# Patient Record
Sex: Male | Born: 1985 | Race: White | Hispanic: No | Marital: Married | Smoking: Current every day smoker
Health system: Southern US, Community
[De-identification: ages and names within clinical notes are randomized; demographics above are authoritative.]

## PROBLEM LIST (undated history)

## (undated) DIAGNOSIS — M549 Dorsalgia, unspecified: Secondary | ICD-10-CM

## (undated) DIAGNOSIS — M419 Scoliosis, unspecified: Secondary | ICD-10-CM

## (undated) DIAGNOSIS — M51369 Other intervertebral disc degeneration, lumbar region without mention of lumbar back pain or lower extremity pain: Secondary | ICD-10-CM

## (undated) DIAGNOSIS — J45909 Unspecified asthma, uncomplicated: Secondary | ICD-10-CM

## (undated) DIAGNOSIS — M5136 Other intervertebral disc degeneration, lumbar region: Secondary | ICD-10-CM

## (undated) HISTORY — PX: OTHER SURGICAL HISTORY: SHX169

## (undated) HISTORY — PX: FRACTURE SURGERY: SHX138

## (undated) HISTORY — DX: Other intervertebral disc degeneration, lumbar region: M51.36

## (undated) HISTORY — DX: Scoliosis, unspecified: M41.9

## (undated) HISTORY — DX: Other intervertebral disc degeneration, lumbar region without mention of lumbar back pain or lower extremity pain: M51.369

## (undated) HISTORY — PX: LUNG SURGERY: SHX703

## (undated) HISTORY — PX: LEG SURGERY: SHX1003

---
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
## 2004-01-05 ENCOUNTER — Inpatient Hospital Stay (HOSPITAL_COMMUNITY): Admission: AC | Admit: 2004-01-05 | Discharge: 2004-01-19 | Payer: Self-pay

## 2004-01-07 ENCOUNTER — Ambulatory Visit: Payer: Self-pay | Admitting: Plastic Surgery

## 2004-04-20 ENCOUNTER — Ambulatory Visit (HOSPITAL_COMMUNITY): Admission: RE | Admit: 2004-04-20 | Discharge: 2004-04-21 | Payer: Self-pay | Admitting: Ophthalmology

## 2005-08-18 IMAGING — CR DG CHEST 1V PORT
1 series · 1 of 1 positions shown · non-contrast
Comparison: 01/09/04.

CLINICAL DATA: Head injury.  Trauma.  Follow-up aeration.  
 PORTABLE CHEST 01/10/04 TAKEN AT 6806 HOURS:

[view not recorded]
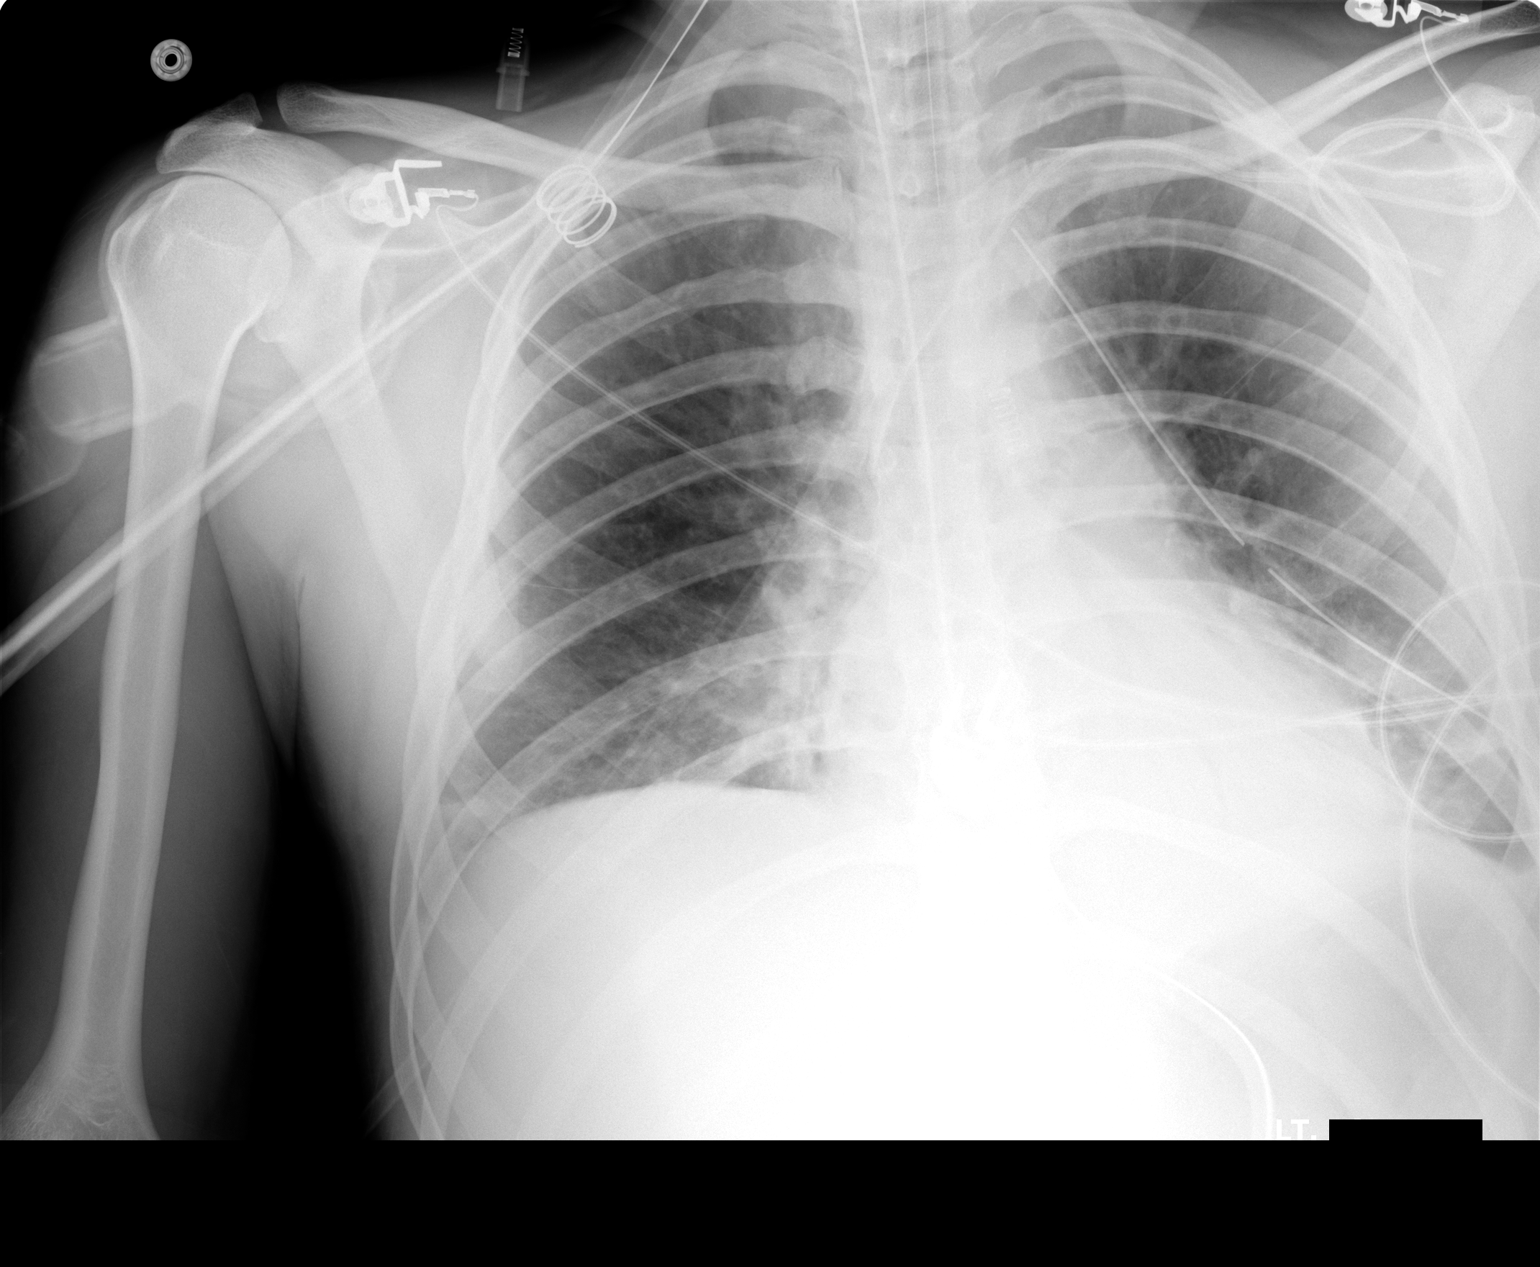

[1 of 1 positions shown; findings below may reference images not displayed]

There are bibasilar atelectatic changes which are stable.  Apical pleural capping noted on the left ? unchanged.  There is no pneumothorax.  Left-sided chest tube remains in satisfactory position as does the endotracheal tube and NG tube.  Central venous catheter tip is in the superior vena cava.
IMPRESSION: Bibasilar atelectatic changes.  No significant change in aeration.

## 2005-08-22 IMAGING — CR DG CHEST 1V PORT
1 series · 1 of 1 positions shown · non-contrast
Comparison: 01/13/04
 AP chest at 3271 hours shows no discernible change in position of the left subclavian central line.

CLINICAL DATA: questionable reposition of the central line in this agitated patient 
 PORTABLE CHEST

[view not recorded]
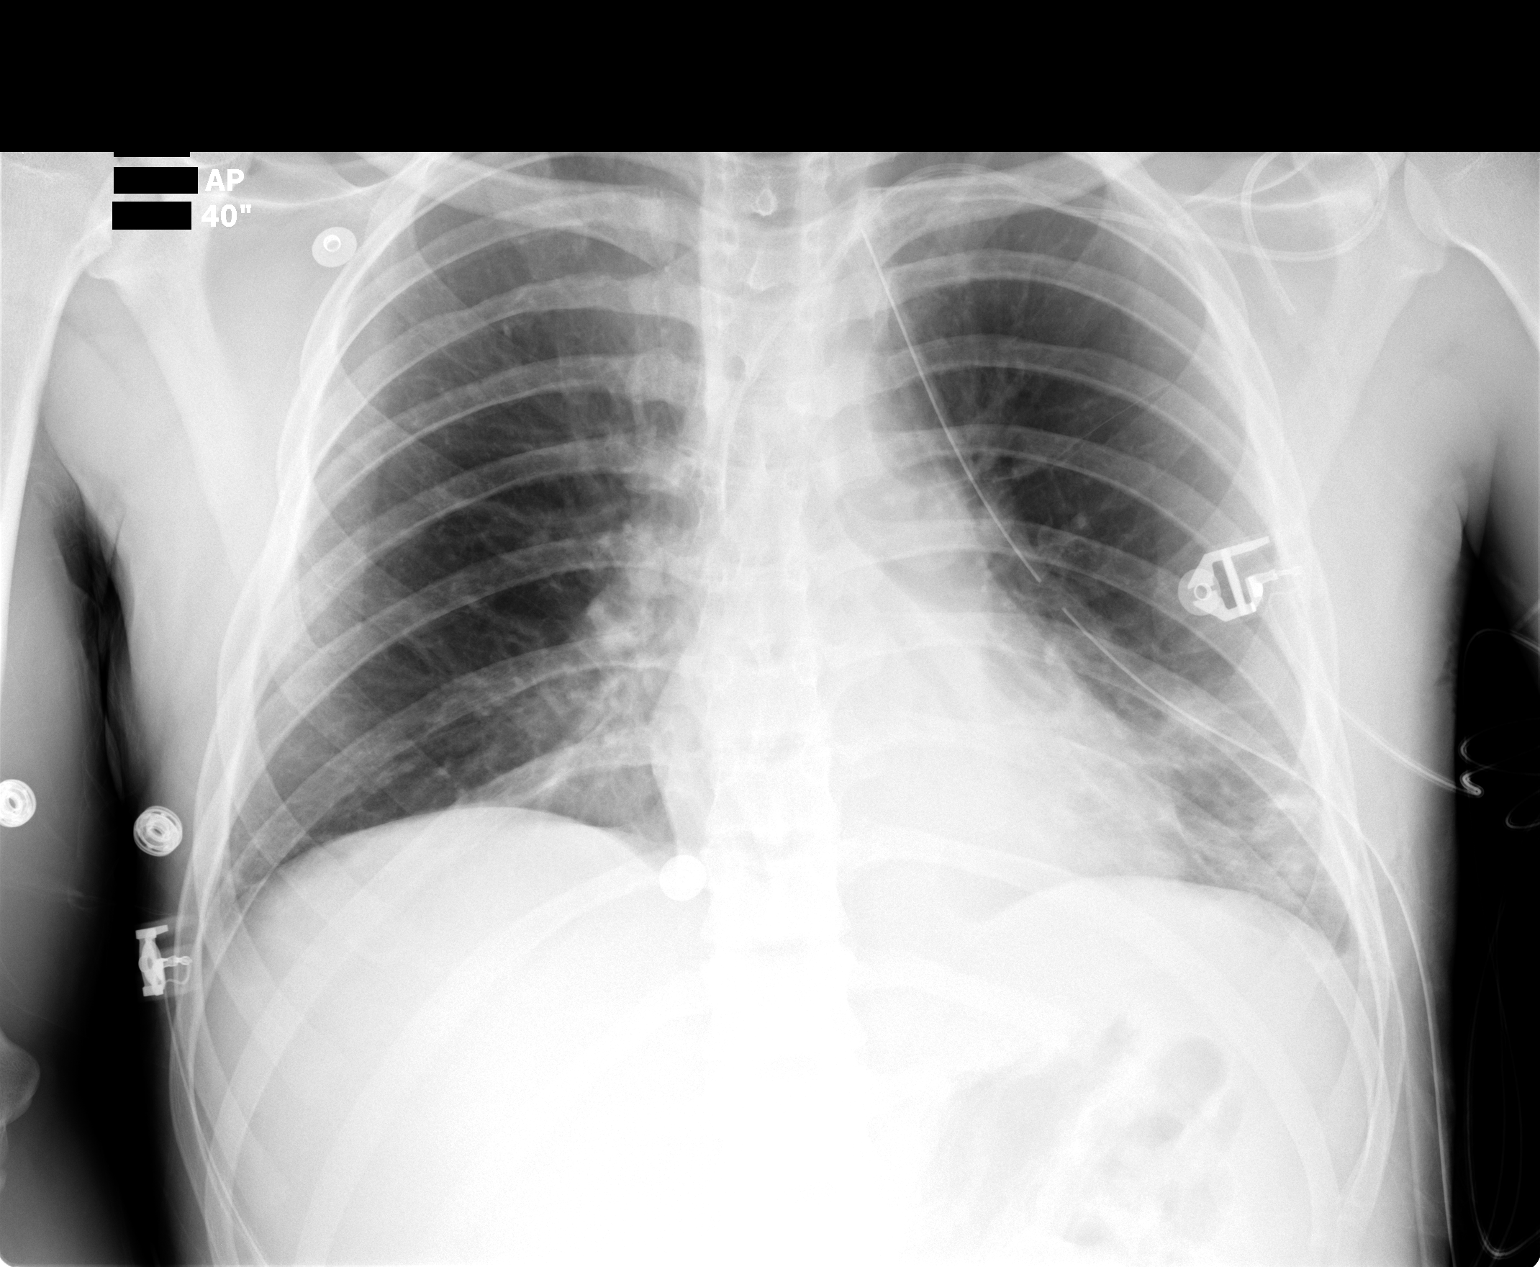

[1 of 1 positions shown; findings below may reference images not displayed]

Left chest tube remains in place without evidence for pneumothorax.  The pleural thickening in the left apex is unchanged.  Atelectasis at the left base is decreased in the interval.  Bones are unremarkable.  
 IMPRESSION
 1.  Decrease in left base atelectasis.
 2.  No discernible interval change in position of the left subclavian central line.

## 2005-08-23 IMAGING — CR DG CHEST 1V PORT
1 series · 1 of 1 positions shown · non-contrast
Comparison: none

CLINICAL DATA: Head injury with trauma.
 PORTABLE CHEST:
 Comparison 01/14/04.  
 AP chest at 2292 hours shows interval removal of the left chest tube without evidence for pneumothorax.  There is increased left base atelectasis.  The left subclavian central line remains in place.  Small left pleural effusion noted.

[view not recorded]
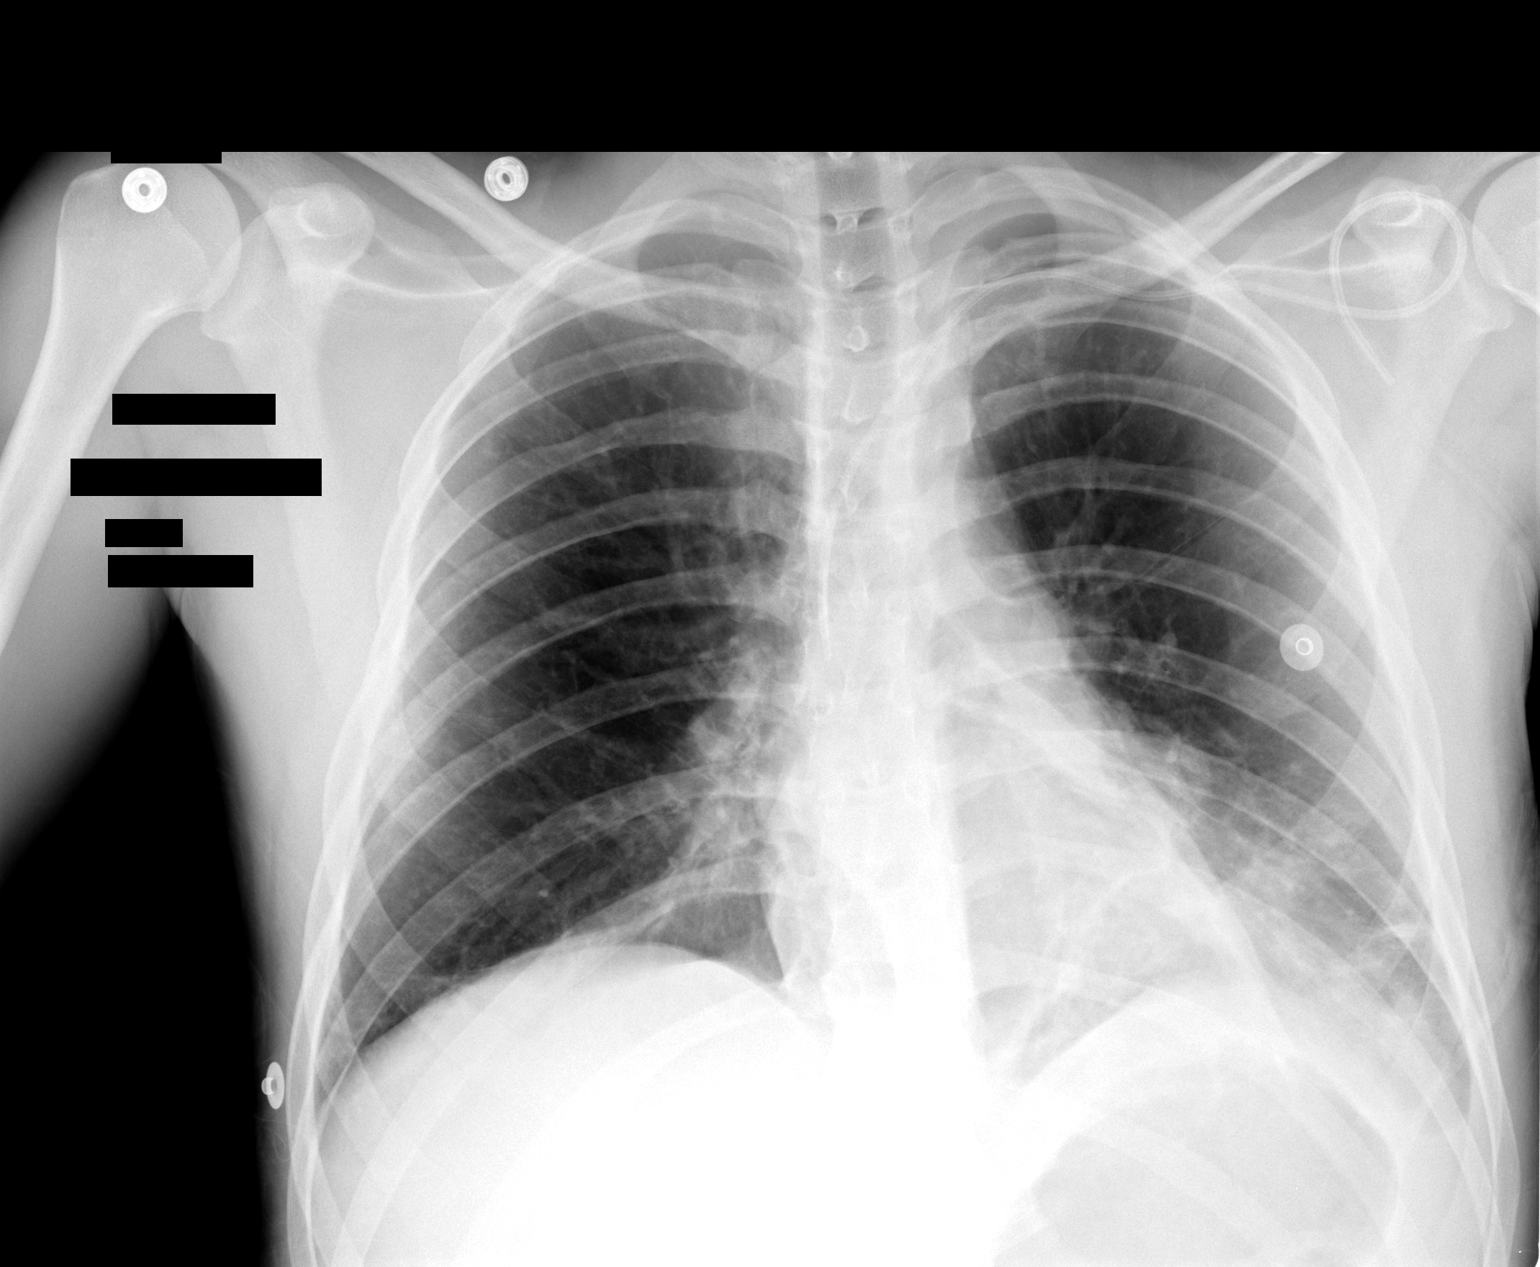

[1 of 1 positions shown; findings below may reference images not displayed]

IMPRESSION: 1.  Status post left chest tube removal without pneumothorax. 
 2.  Left base atelectasis with tiny left effusion.

## 2009-04-11 ENCOUNTER — Emergency Department (HOSPITAL_COMMUNITY): Admission: EM | Admit: 2009-04-11 | Discharge: 2009-04-11 | Payer: Self-pay | Admitting: Emergency Medicine

## 2010-07-09 ENCOUNTER — Other Ambulatory Visit: Payer: Self-pay | Admitting: Neurological Surgery

## 2010-07-09 DIAGNOSIS — M545 Low back pain: Secondary | ICD-10-CM

## 2010-07-12 ENCOUNTER — Ambulatory Visit
Admission: RE | Admit: 2010-07-12 | Discharge: 2010-07-12 | Disposition: A | Discharge status: Home / Self Care | Payer: Self-pay | Source: Ambulatory Visit | Attending: Neurological Surgery | Admitting: Neurological Surgery

## 2010-07-12 DIAGNOSIS — M545 Low back pain: Secondary | ICD-10-CM

## 2010-07-23 NOTE — Op Note (Signed)
NAME:  Roberto Dudley, DAHAN.:  [id number]   MEDICAL RECORD NO.:  [id number]          PATIENT TYPE:  OIB   LOCATION:  2899                         FACILITY:  [facility]   PHYSICIAN:  Doralee Albino. Carola Frost, M.D. DATE OF BIRTH:  Dec 29, 1985   DATE OF PROCEDURE:  04/20/2004  DATE OF DISCHARGE:                                 OPERATIVE REPORT   PREOPERATIVE DIAGNOSIS:  Delayed union left grade 3B tibia.   POSTOPERATIVE DIAGNOSIS:  Delayed union left grade 3B tibia.   PROCEDURE:  1.  Left tibia nail removal.  2.  Left tibia compression nailing w/ DePuy 10mm x , dynamically      locked.  3.  Left fibular osteotomy.   SURGEON:  Doralee Albino. Carola Frost, M.D.   ASSISTANT:  Cecil Cranker, P.A.-C.   ANESTHESIA:  General.   COMPLICATIONS:  None.   SPECIMENS:  Two aerobic and anaerobic cultures sent to microbiology for  analysis.   ESTIMATED BLOOD LOSS:  Less than 100 mL.   DISPOSITION:  PACU.   CONDITION:  Stable.   INDICATIONS FOR PROCEDURE:  Roberto Dudley is a young man who sustained a grade 3B  open tibial fracture over three months ago which was treated initially with  irrigation, debridement, and intramedullary nailing, followed by subsequent  irrigation, debridement, and finally a soft tissue flap performed by Dr.  Lenis Noon.  We continued to follow him for his fracture.  He has gone on to  heal all his soft tissue wounds which were considerable and also to re-  establish full range of motion of the knee and ankle.  Unfortunately, he has  developed over the past month, a more clearly defined nonunion site along  the mid shaft and area of his graft.  He has had no signs of infection such  as fever, malaise, or even significant pain.  We discussed with him the  failure of his tibia to progress toward union and discussed, at length, the  alternatives which included dynization and open iliac crest bone graft as  well as possible exchange nailing.  We felt that exchange  nailing with  fibular osteotomy would offer him the best chance to progress toward union  without sustaining hardware failure and without the need to formally open  his flap site with greater risk to the soft tissue flap.  He understood the  risks to include damage to the pedicle of his flap, nerve injury, vessel  injury, malunion, nonunion, infection, and need for further surgery.  After  discussion of these risks and benefits, he did elect to proceed.   DESCRIPTION OF PROCEDURE:  Roberto Dudley was taken to the operating room after  obtaining preoperative laboratory studies including sed rate and CRP.  After  cultures were obtained, he was administered antibiotics consisting of Ancef.  Stab incisions were made through his old wounds to remove proximal two  locking screws and one of the distal locking screws.  The other was  partially backed out and left in place to prevent rotation during capture of  the tibial nail.  A 15 mm incision over the anterior aspect  of the knee was  made and dissection was carried through the paratenon and around the medial  side of the patellar tendon to the proximal aspect of the knee where, using  an instrument, the tip of the nail was identified.  It was then captured and  after removing the last distal locking screw, it was extracted.  It was then  sequentially reamed from 8 mm which was the diameter of the nail up to 11 mm  without encountering any resistance until the 10.5 mm reamer.  We did check  and make sure that this was not excessively reaming the canal.  We then  inserted a 10 mm by 315 mm DePuy nail.  We then placed the two static distal  locks, angling our distal locks anterior to the fibula so as not to  interfere with the syndesmosis.  Once these two distal locks were placed,  the nail was then back flapped to see if the delayed union site closed down  appreciably.  It did close some but not completely, consequently, we deemed  it necessary to  perform a formal fibular osteotomy.  This was performed  proximal to the area of the flap.  This did incur possible risk to the  peroneal artery which may have been contributing to the flap blood supply,  but a more distal osteotomy to the flap would have affected his ankle  mechanics.  We performed a formal open exposure of the fibula to minimize  the risks to his arterial supply and placed retractors subperiosteally  circumferentially around the fibula.  We were also careful to minimize  retraction against the superficial peroneal nerve.  The sagittal saw was  used to remove 6 mm of bone.  The nail was once again back flapped several  times and then locked dynamically with one proximal screw.  We obtained  multiple images throughout the procedure in order to insure anatomic  alignment remained and final x-rays also confirmed appropriate alignment,  length of the nail, and hardware position.  The tourniquet was never  inflated during the case.  All wounds were copiously irrigated and then  closed in standard layered fashion with 2-0 Vicryl and staples for the skin.  A sterile dressing was applied but no splint.  He was then taken to the PACU  in stable condition.   PROGNOSIS:  Roberto Dudley's prognosis for healing is good so long as his  cultures remain negative.  We were able to replace his nail with a  considerably more stout nail which should allow him to weight bear with  considerably less risks of hardware failure.  With the osteotomy, he should  be able to also obtain some compression at the fracture site.  He will need  to remain compliant with smoking cessation and also has been taken off  ibuprofen and other NSAIDs as this may contribute to nonunion, as well.  He  should be able to maintain both knee and ankle motion.  We will put him on  Fragmin during his hospital stay for DVT prophylaxis.  It is unlikely that  he will need prolonged prophylaxis as he has been  mobile.     MHH/MEDQ  D:  04/20/2004  T:  04/20/2004  Job:  [id number]

## 2010-07-23 NOTE — Op Note (Signed)
NAME:  Roberto Dudley, Roberto Dudley NO.:  [id number]   MEDICAL RECORD NO.:  [id number]                   PATIENT TYPE:  EMS   LOCATION:  MAJO                                 FACILITY:  [facility]   PHYSICIAN:  Adolph Pollack, M.D.            DATE OF BIRTH:  1985/05/12   DATE OF PROCEDURE:  10/02/2001  DATE OF DISCHARGE:  10/01/2001                                 OPERATIVE REPORT   PREOPERATIVE DIAGNOSIS:  Pilonidal abscess.   POSTOPERATIVE DIAGNOSIS:  Extensive pilonidal abscess.   PROCEDURE:  Incision and drainage of pilonidal abscess and extensive  pilonidal cystectomy.   SURGEON:  Adolph Pollack, M.D.   ANESTHESIA:  General.   INDICATIONS FOR SURGERY:  The patient is a 25 year old male who apparently  fell on a skateboard some time in the recent past.  He had been complaining  of gluteal cleft pain.  He was taken to the emergency department Monday  night and then sent over to the office where he saw Dr. Lindie Spruce this morning.  He had an extensive abscess that Dr. Lindie Spruce felt needed to be drained in the  operating room and now he presents for that.  I had a long discussion with  him about drainage of the pilonidal abscess in possible staged process with  drainage of the abscess and a cystectomy, or a large procedure all at once.  The risks, specifically bleeding, were explained to him.  His mom was  present as well.   OPERATIVE TECHNIQUE:  He was placed supine on the stretcher and then general  anesthesia was administered.  He was then placed prone on the operating  table and pressure points were padded.  He had a draining abscess to the  right of the gluteal cleft in the inferior aspect.  The area was shaved and  sterilely prepped and draped, and placed a hemostat into the cavity.  It was  a very large cavity with purulent drainage and I pulled out a large tuft of  hair.  I subsequently began opening this up and connecting it with the  gluteal cleft.   He had multiple little pits present and these were draining  purulent material as well.  I found multiple small abscess pockets and then  I found a cyst with hair in it.  I debrided the cyst wall, removed the hair  as much as possible, but essentially I extended the incision all the way up  the gluteal cleft.  I then debrided more of the inflamed material and  inflamed cyst.  I felt I got the majority of it.  I found no more hair.   I then controlled bleeding with cautery, using it extensively.  I then  packed the wound and when hemostasis was adequate I removed the dry packs.  I inspected it again and noted no further bleeding.  I then dampened three  4x4 gauze sponges and packed  them tightly into the wound.  This was covered  by dry 4x4 gauze and then by an ABD pad.  He was then placed supine on the  stretcher, extubated, and taken to the recovery room in satisfactory  condition.   He tolerated the procedure well without apparent complications.  His mother  was given specific instructions and told to call if there ws bleeding.  He  will return to the office to be seen in 48 hours for dressing change.                                                   Adolph Pollack, M.D.    Kari Baars  D:  10/02/2001  T:  10/04/2001  Job:  [id number]   cc:   Bryan Lemma. Manus Gunning, M.D.

## 2012-01-25 ENCOUNTER — Emergency Department (HOSPITAL_COMMUNITY)
Admission: EM | Admit: 2012-01-25 | Discharge: 2012-01-25 | Disposition: A | Discharge status: Home / Self Care | Payer: Medicaid Other | Attending: Emergency Medicine | Admitting: Emergency Medicine

## 2012-01-25 ENCOUNTER — Emergency Department (HOSPITAL_COMMUNITY): Payer: Medicaid Other

## 2012-01-25 ENCOUNTER — Encounter (HOSPITAL_COMMUNITY): Payer: Self-pay | Admitting: Emergency Medicine

## 2012-01-25 DIAGNOSIS — S91109A Unspecified open wound of unspecified toe(s) without damage to nail, initial encounter: Secondary | ICD-10-CM | POA: Insufficient documentation

## 2012-01-25 DIAGNOSIS — Z23 Encounter for immunization: Secondary | ICD-10-CM | POA: Insufficient documentation

## 2012-01-25 DIAGNOSIS — S91119A Laceration without foreign body of unspecified toe without damage to nail, initial encounter: Secondary | ICD-10-CM

## 2012-01-25 DIAGNOSIS — J45909 Unspecified asthma, uncomplicated: Secondary | ICD-10-CM | POA: Insufficient documentation

## 2012-01-25 DIAGNOSIS — W268XXA Contact with other sharp object(s), not elsewhere classified, initial encounter: Secondary | ICD-10-CM | POA: Insufficient documentation

## 2012-01-25 DIAGNOSIS — F17200 Nicotine dependence, unspecified, uncomplicated: Secondary | ICD-10-CM | POA: Insufficient documentation

## 2012-01-25 DIAGNOSIS — Y9289 Other specified places as the place of occurrence of the external cause: Secondary | ICD-10-CM | POA: Insufficient documentation

## 2012-01-25 DIAGNOSIS — Y9389 Activity, other specified: Secondary | ICD-10-CM | POA: Insufficient documentation

## 2012-01-25 HISTORY — DX: Unspecified asthma, uncomplicated: J45.909

## 2012-01-25 MED ORDER — TRAMADOL HCL 50 MG PO TABS: 50.0000 mg | ORAL_TABLET | Freq: Four times a day (QID) | ORAL | Status: DC | PRN

## 2012-01-25 MED ORDER — CIPROFLOXACIN HCL 500 MG PO TABS: 500.0000 mg | ORAL_TABLET | Freq: Two times a day (BID) | ORAL | Status: DC

## 2012-01-25 MED ORDER — TETANUS-DIPHTH-ACELL PERTUSSIS 5-2.5-18.5 LF-MCG/0.5 IM SUSP
0.5000 mL | Freq: Once | INTRAMUSCULAR | Status: AC
  Administered 2012-01-25: 0.5 mL via INTRAMUSCULAR
  Filled 2012-01-25: qty 0.5

## 2012-01-25 NOTE — ED Notes (Signed)
Patient transported to X-ray 

## 2012-01-25 NOTE — ED Provider Notes (Signed)
Medical screening examination/treatment/procedure(s) were performed by non-physician practitioner and as supervising physician I was immediately available for consultation/collaboration.   Tobin Chad, MD 01/25/12 [phone]

## 2012-01-25 NOTE — ED Notes (Signed)
Laceration to posterior left second digit. Bleeding is controlled at present.

## 2012-01-25 NOTE — ED Provider Notes (Signed)
History     CSN: [id number]  Arrival date & time 01/25/12  9562   First MD Initiated Contact with Patient 01/25/12 0848      Chief Complaint  Patient presents with  . Extremity Laceration    (Consider location/radiation/quality/duration/timing/severity/associated sxs/prior treatment) HPI Presents with chief complaint of laceration to the plantar surface of the second and third left toes.  Patient states that he stepped on a piece of glass in his hallway last night.  Bleeding is currently controlled.  Denies any numbness or tingling of the feet.  Unsure of last tetanus vaccination.  No paresthesias.  Able to move toes.  Denies fevers, chills, myalgias, arthralgias. Denies DOE, SOB, chest tightness or pressure, radiation to left arm, jaw or back, or diaphoresis. Denies dysuria, flank pain, suprapubic pain, frequency, urgency, or hematuria. Denies headaches, light headedness, weakness, visual disturbances. Denies abdominal pain, nausea, vomiting, diarrhea or constipation.   Past Medical History  Diagnosis Date  . Asthma     Past Surgical History  Procedure Date  . Leg surgery   . Trhoat surgery   . Lung surgery   . Fracture surgery     No family history on file.  History  Substance Use Topics  . Smoking status: Current Every Day Smoker -- 0.5 packs/day    Types: Cigarettes  . Smokeless tobacco: Not on file  . Alcohol Use: No      Review of Systems Ten systems are reviewed and are negative for acute change except as noted in the HPI  Allergies  Review of patient's allergies indicates no known allergies.  Home Medications   Current Outpatient Rx  Name  Route  Sig  Dispense  Refill  . CIPROFLOXACIN HCL 500 MG PO TABS   Oral   Take 1 tablet (500 mg total) by mouth 2 (two) times daily. One po bid x 7 days   14 tablet   0   . TRAMADOL HCL 50 MG PO TABS   Oral   Take 1 tablet (50 mg total) by mouth every 6 (six) hours as needed for pain.   15 tablet   0      BP 133/87  Pulse 87  Temp 98 F (36.7 C) (Oral)  Resp 19  Wt 157 lb (71.215 kg)  SpO2 99%  Physical Exam  Musculoskeletal:       Feet:   Physical Exam  Nursing note and vitals reviewed. Constitutional: He appears well-developed and well-nourished. No distress.  HENT:  Head: Normocephalic and atraumatic.  Eyes: Conjunctivae normal are normal. No scleral icterus.  Neck: Normal range of motion. Neck supple.  Cardiovascular: Normal rate, regular rhythm and normal heart sounds.   Pulmonary/Chest: Effort normal and breath sounds normal. No respiratory distress.  Abdominal: Soft. There is no tenderness.  Musculoskeletal: He exhibits no edema.  Neurological: He is alert.  Skin: Skin is warm and dry. He is not diaphoretic.  Psychiatric: His behavior is normal.    ED Course  Procedures (including critical care time)  Labs Reviewed - No data to display Dg Foot Complete Left  01/25/2012  *RADIOLOGY REPORT*  Clinical Data: Left foot pain and lacerations.  Stepped in glass. Evaluate for foreign body.  LEFT FOOT - COMPLETE 3+ VIEW  Comparison: 01/05/2004.  Findings: No acute osseous or joint abnormality.  No radiopaque foreign body.  Intramedullary rod and two distal interlocking screws are incidentally imaged in the distal tibia.  IMPRESSION: No acute osseous or joint abnormality.  No radiopaque  foreign body.   Original Report Authenticated By: Leanna Battles, M.D.    LACERATION REPAIR Performed by: Arthor Captain Authorized by: Arthor Captain Consent: Verbal consent obtained. Risks and benefits: risks, benefits and alternatives were discussed Consent given by: patient Patient identity confirmed: provided demographic data Prepped and Draped in normal sterile fashion Wound explored  Laceration Location: plantar surface left 2nd toe  Laceration Length: 2cm  No Foreign Bodies seen or palpated  Anesthesia: local infiltration  Local anesthetic: lidocaine 1% without  epinephrine  Anesthetic total: 2 ml  Irrigation method: syringe Amount of cleaning: standard  Skin closure: 4.0 prolene  Number of sutures: 4  Technique: so  Patient tolerance: Patient tolerated the procedure well with no immediate complications.   1. Laceration of toe of left foot       MDM  Laceration T. Updated Cover for Pseudomonas Discharge with pain control and antibiotics.  Followup in 8 days for suture removal. Discussed reasons to seek immediate care. Patient expresses understanding and agrees with plan.         Arthor Captain, PA-C 01/25/12 1611

## 2012-01-25 NOTE — ED Notes (Signed)
States that he stepped on glass around 0000. States that [employer] came and wrapped wound and informed him that he may need stitches.

## 2012-03-18 ENCOUNTER — Emergency Department (HOSPITAL_COMMUNITY)
Admission: EM | Admit: 2012-03-18 | Discharge: 2012-03-18 | Disposition: A | Discharge status: Home / Self Care | Payer: Medicaid Other | Source: Home / Self Care | Attending: Emergency Medicine | Admitting: Emergency Medicine

## 2012-03-18 ENCOUNTER — Encounter (HOSPITAL_COMMUNITY): Payer: Self-pay | Admitting: Emergency Medicine

## 2012-03-18 DIAGNOSIS — H1030 Unspecified acute conjunctivitis, unspecified eye: Secondary | ICD-10-CM

## 2012-03-18 DIAGNOSIS — H1032 Unspecified acute conjunctivitis, left eye: Secondary | ICD-10-CM

## 2012-03-18 MED ORDER — POLYMYXIN B-TRIMETHOPRIM 10000-0.1 UNIT/ML-% OP SOLN: 1.0000 [drp] | OPHTHALMIC | Status: DC

## 2012-03-18 NOTE — ED Provider Notes (Signed)
History     CSN: [id number]  Arrival date & time 03/18/12  1630   First MD Initiated Contact with Patient 03/18/12 1813      Chief Complaint  Patient presents with  . Conjunctivitis    left eye red and drainage. denies irritation and pain. symptoms x today.    (Consider location/radiation/quality/duration/timing/severity/associated sxs/prior treatment) Patient is a 27 y.o. male presenting with conjunctivitis. The history is provided by the patient.  Conjunctivitis  The current episode started today. The problem occurs frequently. The problem has been unchanged. The problem is mild. Nothing relieves the symptoms. Nothing aggravates the symptoms. Associated symptoms include congestion, rhinorrhea, eye discharge and eye redness. Pertinent negatives include no fever, no decreased vision, no double vision, no eye itching, no photophobia, no ear discharge, no ear pain, no headaches, no hearing loss, no mouth sores, no sore throat, no stridor, no swollen glands and no eye pain. The eye pain is not associated with movement. The eyelid exhibits no abnormality. He has been behaving normally. There were no sick contacts.    Past Medical History  Diagnosis Date  . Asthma     Past Surgical History  Procedure Date  . Leg surgery   . Trhoat surgery   . Lung surgery   . Fracture surgery     History reviewed. No pertinent family history.  History  Substance Use Topics  . Smoking status: Current Every Day Smoker -- 0.5 packs/day    Types: Cigarettes  . Smokeless tobacco: Not on file  . Alcohol Use: No      Review of Systems  Constitutional: Negative for fever.  HENT: Positive for congestion and rhinorrhea. Negative for hearing loss, ear pain, sore throat, mouth sores and ear discharge.   Eyes: Positive for discharge and redness. Negative for double vision, photophobia, pain and itching.  Respiratory: Negative for stridor.   Neurological: Negative for headaches.  All other systems  reviewed and are negative.    Allergies  Review of patient's allergies indicates no known allergies.  Home Medications   Current Outpatient Rx  Name  Route  Sig  Dispense  Refill  . CIPROFLOXACIN HCL 500 MG PO TABS   Oral   Take 1 tablet (500 mg total) by mouth 2 (two) times daily. One po bid x 7 days   14 tablet   0   . TRAMADOL HCL 50 MG PO TABS   Oral   Take 1 tablet (50 mg total) by mouth every 6 (six) hours as needed for pain.   15 tablet   0   . POLYMYXIN B-TRIMETHOPRIM 10000-0.1 UNIT/ML-% OP SOLN   Left Eye   Place 1 drop into the left eye every 4 (four) hours.   10 mL   0     BP 124/81  Pulse 84  Temp 98.4 F (36.9 C) (Oral)  Resp 18  SpO2 99%  Physical Exam  Nursing note and vitals reviewed. Constitutional: He is oriented to person, place, and time. Vital signs are normal. He appears well-developed and well-nourished. He is active and cooperative.  HENT:  Head: Normocephalic.  Right Ear: Tympanic membrane and external ear normal.  Left Ear: Tympanic membrane and external ear normal.  Nose: Nose normal. Right sinus exhibits no maxillary sinus tenderness and no frontal sinus tenderness. Left sinus exhibits no maxillary sinus tenderness and no frontal sinus tenderness.  Mouth/Throat: Uvula is midline and oropharynx is clear and moist. No oropharyngeal exudate.  Eyes: EOM are normal. Pupils are  equal, round, and reactive to light. Right eye exhibits no chemosis, no discharge, no exudate and no hordeolum. No foreign body present in the right eye. Left eye exhibits no chemosis, no discharge, no exudate and no hordeolum. No foreign body present in the left eye. Right conjunctiva is not injected. Right conjunctiva has no hemorrhage. Left conjunctiva is injected. Left conjunctiva has no hemorrhage. No scleral icterus.  Neck: Trachea normal and normal range of motion. Neck supple.  Cardiovascular: Normal rate, regular rhythm, normal heart sounds, intact distal pulses  and normal pulses.   Pulmonary/Chest: Effort normal and breath sounds normal.  Lymphadenopathy:       Head (right side): No submental, no submandibular, no tonsillar, no preauricular, no posterior auricular and no occipital adenopathy present.       Head (left side): No submental, no submandibular, no tonsillar, no preauricular, no posterior auricular and no occipital adenopathy present.    He has no cervical adenopathy.  Neurological: He is alert and oriented to person, place, and time. No cranial nerve deficit or sensory deficit.  Skin: Skin is warm and dry.  Psychiatric: He has a normal mood and affect. His speech is normal and behavior is normal. Judgment and thought content normal. Cognition and memory are normal.    ED Course  Procedures (including critical care time)  Labs Reviewed - No data to display No results found.   1. Conjunctivitis, acute, left eye       MDM  Warm compresses.  Eye drops as prescribed.           Johnsie Kindred, NP 03/18/12 1819

## 2012-03-18 NOTE — ED Notes (Signed)
Pt states that he woke this a.m with redness of the left eye with some drainage. Pt denies irritation or pain and any other symptoms.

## 2012-03-18 NOTE — ED Provider Notes (Signed)
Medical screening examination/treatment/procedure(s) were performed by non-physician practitioner and as supervising physician I was immediately available for consultation/collaboration.  Anna Livers, M.D.   Stepfon Rawles C Karisa Nesser, MD 03/18/12 1909 

## 2013-10-11 ENCOUNTER — Emergency Department: Payer: Self-pay | Admitting: Student

## 2013-11-11 ENCOUNTER — Emergency Department: Payer: Self-pay | Admitting: Emergency Medicine

## 2014-08-18 ENCOUNTER — Encounter: Payer: Self-pay | Admitting: Emergency Medicine

## 2014-08-18 ENCOUNTER — Emergency Department: Payer: Medicaid Other

## 2014-08-18 ENCOUNTER — Emergency Department
Admission: EM | Admit: 2014-08-18 | Discharge: 2014-08-18 | Disposition: A | Discharge status: Home / Self Care | Payer: Medicaid Other | Attending: Emergency Medicine | Admitting: Emergency Medicine

## 2014-08-18 DIAGNOSIS — X58XXXA Exposure to other specified factors, initial encounter: Secondary | ICD-10-CM | POA: Insufficient documentation

## 2014-08-18 DIAGNOSIS — Y9289 Other specified places as the place of occurrence of the external cause: Secondary | ICD-10-CM | POA: Diagnosis not present

## 2014-08-18 DIAGNOSIS — Y9389 Activity, other specified: Secondary | ICD-10-CM | POA: Insufficient documentation

## 2014-08-18 DIAGNOSIS — S8992XA Unspecified injury of left lower leg, initial encounter: Secondary | ICD-10-CM | POA: Diagnosis present

## 2014-08-18 DIAGNOSIS — Y998 Other external cause status: Secondary | ICD-10-CM | POA: Insufficient documentation

## 2014-08-18 DIAGNOSIS — Z720 Tobacco use: Secondary | ICD-10-CM | POA: Diagnosis not present

## 2014-08-18 DIAGNOSIS — M25462 Effusion, left knee: Secondary | ICD-10-CM

## 2014-08-18 MED ORDER — DICLOFENAC POTASSIUM 50 MG PO TABS: 50.0000 mg | ORAL_TABLET | Freq: Three times a day (TID) | ORAL | Status: DC

## 2014-08-18 MED ORDER — HYDROCODONE-ACETAMINOPHEN 5-325 MG PO TABS: 1.0000 | ORAL_TABLET | ORAL | Status: DC | PRN

## 2014-08-18 NOTE — Discharge Instructions (Signed)

## 2014-08-18 NOTE — ED Notes (Signed)
assessmnet per PA 

## 2014-08-18 NOTE — ED Notes (Signed)
Left knee pain worse after playing game , x2 months ago original injury " fells like it pops every so often",
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
## 2014-12-05 ENCOUNTER — Emergency Department: Payer: Medicaid Other

## 2014-12-05 ENCOUNTER — Emergency Department
Admission: EM | Admit: 2014-12-05 | Discharge: 2014-12-05 | Disposition: A | Discharge status: Home / Self Care | Payer: Medicaid Other | Attending: Student | Admitting: Student

## 2014-12-05 ENCOUNTER — Encounter: Payer: Self-pay | Admitting: Emergency Medicine

## 2014-12-05 DIAGNOSIS — Y9389 Activity, other specified: Secondary | ICD-10-CM | POA: Diagnosis not present

## 2014-12-05 DIAGNOSIS — S63601A Unspecified sprain of right thumb, initial encounter: Secondary | ICD-10-CM | POA: Diagnosis not present

## 2014-12-05 DIAGNOSIS — Y9289 Other specified places as the place of occurrence of the external cause: Secondary | ICD-10-CM | POA: Diagnosis not present

## 2014-12-05 DIAGNOSIS — W01198A Fall on same level from slipping, tripping and stumbling with subsequent striking against other object, initial encounter: Secondary | ICD-10-CM | POA: Insufficient documentation

## 2014-12-05 DIAGNOSIS — Y998 Other external cause status: Secondary | ICD-10-CM | POA: Insufficient documentation

## 2014-12-05 DIAGNOSIS — Z791 Long term (current) use of non-steroidal anti-inflammatories (NSAID): Secondary | ICD-10-CM | POA: Diagnosis not present

## 2014-12-05 DIAGNOSIS — S6991XA Unspecified injury of right wrist, hand and finger(s), initial encounter: Secondary | ICD-10-CM | POA: Diagnosis present

## 2014-12-05 DIAGNOSIS — Z720 Tobacco use: Secondary | ICD-10-CM | POA: Diagnosis not present

## 2014-12-05 DIAGNOSIS — Z792 Long term (current) use of antibiotics: Secondary | ICD-10-CM | POA: Diagnosis not present

## 2014-12-05 MED ORDER — NAPROXEN 500 MG PO TABS: 500.0000 mg | ORAL_TABLET | Freq: Two times a day (BID) | ORAL | Status: DC

## 2014-12-05 NOTE — ED Provider Notes (Signed)
CSN: [id number]     Arrival date & time 12/05/14  1607 History   First MD Initiated Contact with Patient 12/05/14 1624     Chief Complaint  Patient presents with  . Hand Injury      HPI  Roberto Dudley is a 29 y.o. male presenting with right hand pain and swelling following an injury today. The patient fell today striking his right hand on a plastic kids toy. The toy did not break the skin. Patient denies hitting his head and LOC. The patient's hand became painful, swollen, and bruised. Pain is 7/10 aching pain that does not radiate. Patient has kept ice on the hand. Patient has taken no medications to relieve the pain. Patient states the ROM of his right thumb is decreased. 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 Patient denies fever, chills, SOB, nausea, vomiting, loss of sensation, numbness, and tingling. Patient UTD on tetanus vaccination.  Past Medical History  Diagnosis Date  . Asthma    Past Surgical History  Procedure Laterality Date  . Leg surgery    . Trhoat surgery    . Lung surgery    . Fracture surgery     No family history on file. Social History  Substance Use Topics  . Smoking status: Current Every Day Smoker -- 0.50 packs/day    Types: Cigarettes  . Smokeless tobacco: None  . Alcohol Use: No    Review of Systems  Constitutional: Negative for fever, chills, activity change and appetite change.  HENT: Negative for congestion.   Eyes: Negative for visual disturbance.  Respiratory: Negative for cough and shortness of breath.   Cardiovascular: Negative for chest pain.  Gastrointestinal: Negative for nausea, vomiting, abdominal pain, diarrhea and constipation.  Genitourinary: Negative for dysuria and flank pain.  Musculoskeletal: Negative for back pain.       Positive for right hand pain  Skin: Negative for rash.  Neurological: Negative for dizziness, syncope, weakness, light-headedness and  numbness.      Allergies  Review of patient's allergies indicates no known allergies.  Home Medications   Prior to Admission medications   Medication Sig Start Date End Date Taking? Authorizing Melbert Botelho  ciprofloxacin (CIPRO) 500 MG tablet Take 1 tablet (500 mg total) by mouth 2 (two) times daily. One po bid x 7 days 01/25/12   Arthor Captain, PA-C  diclofenac (CATAFLAM) 50 MG tablet Take 1 tablet (50 mg total) by mouth 3 (three) times daily. 08/18/14   Evangeline Dakin, PA-C  HYDROcodone-acetaminophen (NORCO) 5-325 MG per tablet Take 1-2 tablets by mouth every 4 (four) hours as needed for moderate pain. 08/18/14   Evangeline Dakin, PA-C  naproxen (NAPROSYN) 500 MG tablet Take 1 tablet (500 mg total) by mouth 2 (two) times daily with a meal. 12/05/14   Evon Slack, PA-C  traMADol (ULTRAM) 50 MG tablet Take 1 tablet (50 mg total) by mouth every 6 (six) hours as needed for pain. 01/25/12   Arthor Captain, PA-C  trimethoprim-polymyxin b (POLYTRIM) ophthalmic solution Place 1 drop into the left eye every 4 (four) hours. 03/18/12   Katherine Basset Chatten, NP   BP 137/79 mmHg  Pulse 98  Temp(Src) 98 F (36.7 C) (Oral)  Resp 20  Ht  (1.651 m)  Wt 155 lb (70.308 kg)  BMI 25.79 kg/m2  SpO2 97% Physical Exam  Constitutional: He is oriented to person, place, and  time. He appears well-developed and well-nourished. No distress.  HENT:  Head: Normocephalic and atraumatic.  Eyes: Conjunctivae and EOM are normal. Pupils are equal, round, and reactive to light.  Neck: Normal range of motion. Neck supple.  Cardiovascular: Normal rate, regular rhythm and normal heart sounds.  Exam reveals no gallop and no friction rub.   No murmur heard. Pulmonary/Chest: Effort normal and breath sounds normal. He has no wheezes. He has no rales.  Abdominal: Soft. Bowel sounds are normal. There is no tenderness.  Musculoskeletal:  Swelling noted to volar surface of right hand beneath the thumb with small amount of  bruising. Skin intact. Right thumb tender to palpation. Thumb adduction and abduction reduced. All other fingers with full ROM. Grip strength intact (not using right thumb). Sensation intact bilaterally. Radial pulse 2+ bilaterally.   Lymphadenopathy:    He has no cervical adenopathy.  Neurological: He is alert and oriented to person, place, and time.  Skin: Skin is warm and dry. No rash noted. He is not diaphoretic.  Psychiatric: He has a normal mood and affect.    ED Course  Procedures (including critical care time) Labs Review Labs Reviewed - No data to display  Imaging Review Dg Hand Complete Right  12/05/2014   CLINICAL DATA:  Acute right hand pain and swelling following fall today. Initial encounter.  EXAM: RIGHT HAND - COMPLETE 3+ VIEW  COMPARISON:  None.  FINDINGS: There is no evidence of fracture or dislocation. There is no evidence of arthropathy or other focal bone abnormality. Soft tissues are unremarkable.  IMPRESSION: Negative.   Electronically Signed   By: Harmon Pier M.D.   On: 12/05/2014 17:52   I have personally reviewed and evaluated these images and lab results as part of my medical decision-making.   EKG Interpretation None      MDM   Final diagnoses:  Thumb sprain, right, initial encounter    29 yo male presenting with right hand pain following an injury where he fell onto a plastic toy. Skin is intact. Right thumb swollen with decreased ROM and pain. 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 Right hand Xray is negative. Presentation most consistent with right thumb sprain, likely ulnar collateral ligament. Thumb spica splint applied in ED. Wear splint, if possible, at work and at home. Work note given for 5 days. Rx Naproxen 500 mg BID prn for pain.   Patient voices no other emergency medical complaints at this time. Patient to follow up with orthopedics if symptoms do not improve in the next 5 days.    Evon Slack, PA-C 12/05/14  1839  Evon Slack, PA-C 12/05/14 1839  Gayla Doss, MD 12/06/14 [phone]

## 2014-12-05 NOTE — Discharge Instructions (Signed)
Thumb Sprain °Your exam shows you have a sprained thumb. This means the ligaments around the joint have been torn. Thumb sprains usually take 3-6 weeks to heal. However, severe, unstable sprains may need to be fixed surgically. Sometimes a small piece of bone is pulled off by the ligament. If this is not treated properly, a sprained thumb can lead to a painful, weak joint. Treatment helps reduce pain and shortens the period of disability. °The thumb, and often the wrist, must remain splinted for the first 2-4 weeks to protect the joint. Keep your hand elevated and apply ice packs frequently to the injured area (20-30 minutes every 2-3 hours) for the next 2-4 days. This helps reduce swelling and control pain. Pain medicine may also be used for several days. Motion and strengthening exercises may later be prescribed for the joint to return to normal function. Be sure to see your doctor for follow-up because your thumb joint may require further support with splints, bandages or tape. Please see your doctor or go to the emergency room right away if you have increased pain despite proper treatment, or a numb, cold, or pale thumb. °Document Released: 03/31/2004 Document Revised: 05/16/2011 Document Reviewed: 02/23/2008 °ExitCare® Patient Information ©2015 [employer]. This information is not intended to replace advice given to you by your health care provider. Make sure you discuss any questions you have with your health care provider. ° °

## 2014-12-05 NOTE — ED Notes (Signed)
Larey Seat on one of kids toys and now cant move right thumb normally

## 2015-02-25 ENCOUNTER — Encounter (HOSPITAL_COMMUNITY): Payer: Self-pay | Admitting: Emergency Medicine

## 2015-02-25 ENCOUNTER — Emergency Department (INDEPENDENT_AMBULATORY_CARE_PROVIDER_SITE_OTHER)
Admission: EM | Admit: 2015-02-25 | Discharge: 2015-02-25 | Disposition: A | Discharge status: Home / Self Care | Payer: Medicaid Other | Source: Home / Self Care | Attending: Emergency Medicine | Admitting: Emergency Medicine

## 2015-02-25 DIAGNOSIS — K029 Dental caries, unspecified: Secondary | ICD-10-CM | POA: Diagnosis not present

## 2015-02-25 MED ORDER — CHLORHEXIDINE GLUCONATE 0.12 % MT SOLN: OROMUCOSAL | Status: DC

## 2015-02-25 MED ORDER — PENICILLIN V POTASSIUM 500 MG PO TABS: 500.0000 mg | ORAL_TABLET | Freq: Four times a day (QID) | ORAL | Status: DC

## 2015-02-25 MED ORDER — HYDROCODONE-ACETAMINOPHEN 5-325 MG PO TABS: ORAL_TABLET | ORAL | Status: DC

## 2015-02-25 NOTE — ED Notes (Signed)
Here with c/o toothache that started yesterday evening with swelling and radiating pain to lower L jaw Denies drainage, fever,chills [facility]

## 2015-02-25 NOTE — Discharge Instructions (Signed)
Go to www.goodrx.com to look up your medications. This will give you a list of where you can find your prescriptions at the most affordable prices.  ° °RESOURCE GUIDE ° °Dental Problems ° °Look up http://www.ncdental.org/ncds/Schedule.asp for a schedule of the [employer] [facility]'s free dental clinics called [facility]. They have clinics all around [place]. Get there early and be prepared to wait.  ° °[facility] °[address]  [place], [place] [address] °[phone] ° °[facility] °[address] °[place], [place] °[phone] ° °Patients with Medicaid: °[facility]                     [facility] °[address]                                [address] °Phone:  [phone]                                                  Phone:  [phone] ° °If unable to pay or uninsured, contact:  [facility] or [facility] to become qualified for the adult dental clinic. ° °

## 2015-02-25 NOTE — ED Provider Notes (Signed)
HPI  SUBJECTIVE:  Roberto Dudley is a 29 y.o. male who presents with left posterior upper jaw pain discussed guard just throbbing, radiating up the side of his head starting yesterday evening after eating some chips. He reports left-sided facial swelling. He states symptoms are worse with eating, exposure to air, no alleviating factors. He tried salt water gargles, heating pad, Aleve 250 mg this morning. No nausea, vomiting, fevers, voice changes, trismus, drooling, facial redness, denies any trauma to the tooth itself or to his face. States that he has a "wasn't tooth" coming in in the area of his pain. Past medical history noncontributory, but is negative for diabetes, hypertension, HIV.    Past Medical History  Diagnosis Date  . Asthma     Past Surgical History  Procedure Laterality Date  . Leg surgery    . Trhoat surgery    . Lung surgery    . Fracture surgery      No family history on file.  Social History  Substance Use Topics  . Smoking status: Current Every Day Smoker -- 0.50 packs/day    Types: Cigarettes  . Smokeless tobacco: None  . Alcohol Use: No    No current facility-administered medications for this encounter.  Current outpatient prescriptions:  .  chlorhexidine (PERIDEX) 0.12 % solution, 15 mL swish and spit bid, Disp: 480 mL, Rfl: 0 .  HYDROcodone-acetaminophen (NORCO/VICODIN) 5-325 MG tablet, 1-2 tabs q 6hr prn pain, Disp: 20 tablet, Rfl: 0 .  penicillin v potassium (VEETID) 500 MG tablet, Take 1 tablet (500 mg total) by mouth 4 (four) times daily. X 10 days, Disp: 40 tablet, Rfl: 0 .  trimethoprim-polymyxin b (POLYTRIM) ophthalmic solution, Place 1 drop into the left eye every 4 (four) hours., Disp: 10 mL, Rfl: 0  No Known Allergies   ROS  As noted in HPI.   Physical Exam  BP 155/93 mmHg  Pulse 65  Temp(Src) 98.1 F (36.7 C) (Oral)  Resp 18  SpO2 99%  Constitutional: Well developed, well nourished, appears uncomfortable Eyes:  EOMI,  conjunctiva normal bilaterally HENT: Normocephalic, atraumatic,mucus membranes moist. Tooth #16, left upper third molar with caries,, but intact. Tooth tender to palpation. No gingival swelling. No expansile purulent gingival drainage. No trismus. No appreciable facial swelling. No swelling underneath his tongue. Respiratory: Normal inspiratory effort Cardiovascular: Normal rate GI: nondistended skin: No rash, skin intact Lymph: No cervical lymphadenopathy. Musculoskeletal: no deformities Neurologic: Alert & oriented x 3, no focal neuro deficits Psychiatric: Speech and behavior appropriate   ED Course   Medications - No data to display  No orders of the defined types were placed in this encounter.    No results found for this or any previous visit (from the past 24 hour(s)). No results found.  ED Clinical Impression  Pain due to dental caries   ED Assessment/Plan  Tooth #16 left upper third molar with caries, tender to palpation, no surrounding gingival swelling. Home with penicillin, Peridex, Naprosyn, short course of Norco, dental referral. he declined dental block.  Discussed  MDM, plan and followup with patient. Patient agrees with plan.  *This clinic note was created using Dragon dictation software. Therefore, there may be occasional mistakes despite careful proofreading.  ?   Domenick GongAshley Allyana Vogan, MD 02/27/15 [phone]

## 2015-03-20 ENCOUNTER — Emergency Department: Payer: Medicaid Other

## 2015-03-20 ENCOUNTER — Emergency Department
Admission: EM | Admit: 2015-03-20 | Discharge: 2015-03-20 | Disposition: A | Discharge status: Home / Self Care | Payer: Medicaid Other | Attending: Emergency Medicine | Admitting: Emergency Medicine

## 2015-03-20 ENCOUNTER — Encounter: Payer: Self-pay | Admitting: Emergency Medicine

## 2015-03-20 DIAGNOSIS — G8929 Other chronic pain: Secondary | ICD-10-CM | POA: Diagnosis not present

## 2015-03-20 DIAGNOSIS — M545 Low back pain, unspecified: Secondary | ICD-10-CM

## 2015-03-20 DIAGNOSIS — Z79899 Other long term (current) drug therapy: Secondary | ICD-10-CM | POA: Insufficient documentation

## 2015-03-20 DIAGNOSIS — Z792 Long term (current) use of antibiotics: Secondary | ICD-10-CM | POA: Diagnosis not present

## 2015-03-20 DIAGNOSIS — F17210 Nicotine dependence, cigarettes, uncomplicated: Secondary | ICD-10-CM | POA: Diagnosis not present

## 2015-03-20 HISTORY — DX: Dorsalgia, unspecified: M54.9

## 2015-03-20 MED ORDER — CYCLOBENZAPRINE HCL 5 MG PO TABS: 5.0000 mg | ORAL_TABLET | Freq: Three times a day (TID) | ORAL | Status: DC | PRN

## 2015-03-20 MED ORDER — NAPROXEN 500 MG PO TBEC: 500.0000 mg | DELAYED_RELEASE_TABLET | Freq: Two times a day (BID) | ORAL | Status: DC

## 2015-03-20 NOTE — Discharge Instructions (Signed)

## 2015-03-20 NOTE — ED Provider Notes (Signed)
[facility] Emergency Department Provider Note ____________________________________________  Time seen: 1545  I have reviewed the triage vital signs and the nursing notes.  HISTORY  Chief Complaint  Back Pain  HPI Roberto Dudley is a 30 y.o. male presents to the ED for evaluation of increased lower back pain above baseline. He denies injury or trauma. He indicates working in the yard today, may have aggravated his symptoms. He was unable to report to work today due to the increase pain. He denies incontinence, distal paresthesias, or leg weakness. He rates his discomfort at 5/10 in triage. He essentially just needs a note for calling out of work today. He notes he misses about 2 weeks of work a year due to his back pain.   Past Medical History  Diagnosis Date  . Asthma   . Back pain    There are no active problems to display for this patient.  Past Surgical History  Procedure Laterality Date  . Leg surgery    . Trhoat surgery    . Lung surgery    . Fracture surgery      Current Outpatient Rx  Name  Route  Sig  Dispense  Refill  . chlorhexidine (PERIDEX) 0.12 % solution      15 mL swish and spit bid   480 mL   0   . cyclobenzaprine (FLEXERIL) 5 MG tablet   Oral   Take 1 tablet (5 mg total) by mouth 3 (three) times daily as needed for muscle spasms.   15 tablet   0   . HYDROcodone-acetaminophen (NORCO/VICODIN) 5-325 MG tablet      1-2 tabs q 6hr prn pain   20 tablet   0   . naproxen (EC NAPROSYN) 500 MG EC tablet   Oral   Take 1 tablet (500 mg total) by mouth 2 (two) times daily with a meal.   30 tablet   0   . penicillin v potassium (VEETID) 500 MG tablet   Oral   Take 1 tablet (500 mg total) by mouth 4 (four) times daily. X 10 days   40 tablet   0   . trimethoprim-polymyxin b (POLYTRIM) ophthalmic solution   Left Eye   Place 1 drop into the left eye every 4 (four) hours.   10 mL   0    Allergies Review of patient's  allergies indicates no known allergies.  No family history on file.  Social History Social History  Substance Use Topics  . Smoking status: Current Every Day Smoker -- 0.50 packs/day    Types: Cigarettes  . Smokeless tobacco: None  . Alcohol Use: No   Review of Systems  Constitutional: Negative for fever. Eyes: Negative for visual changes. ENT: Negative for sore throat. Cardiovascular: Negative for chest pain. Respiratory: Negative for shortness of breath. Gastrointestinal: Negative for abdominal pain, vomiting and diarrhea. Genitourinary: Negative for dysuria. Musculoskeletal: Positive for back pain. Skin: Negative for rash. Neurological: Negative for headaches, focal weakness or numbness. ____________________________________________  PHYSICAL EXAM:  VITAL SIGNS: ED Triage Vitals  Enc Vitals Group     BP 03/20/15 1521 145/81 mmHg     Pulse Rate 03/20/15 1521 93     Resp 03/20/15 1521 20     Temp 03/20/15 1522 98.3 F (36.8 C)     Temp Source 03/20/15 1522 Oral     SpO2 03/20/15 1521 98 %     Weight 03/20/15 1521 155 lb (70.308 kg)     Height  03/20/15 1521 5\' 5"  (1.651 m)     Head Cir --      Peak Flow --      Pain Score 03/20/15 1521 5     Pain Loc --      Pain Edu? --      Excl. in GC? --    Constitutional: Alert and oriented. Well appearing and in no distress. Head: Normocephalic and atraumatic.      Eyes: Conjunctivae are normal. PERRL. Normal extraocular movements      Ears: Canals clear. TMs intact bilaterally.   Nose: No congestion/rhinorrhea.   Mouth/Throat: Mucous membranes are moist.   Neck: Supple. No thyromegaly. Hematological/Lymphatic/Immunological: No cervical lymphadenopathy. Cardiovascular: Normal rate, regular rhythm.  Respiratory: Normal respiratory effort. No wheezes/rales/rhonchi. Gastrointestinal: Soft and nontender. No distention. Musculoskeletal: Normal spinal alignment without midline tenderness, spasm, deformity, or  step-off. Normal fluid gait and normal transition Nontender with normal range of motion in all extremities.  Neurologic: Cranial nerves II through XII grossly intact. Normal LE DTRs bilaterally. Normal toe dorsiflexion on exam. Normal gait without ataxia. Normal speech and language. No gross focal neurologic deficits are appreciated. Skin:  Skin is warm, dry and intact. No rash noted. Psychiatric: Mood and affect are normal. Patient exhibits appropriate insight and judgment. ____________________________________________  INITIAL IMPRESSION / ASSESSMENT AND PLAN / ED COURSE  Acute on chronic LBP without evidence of neuromuscular deficit. Patient will be discharged with prescriptions for EC Naprosyn and Flexeril. He will be given a work note for today as requested. Follow-up with [facility] for ongoing management.  ____________________________________________  FINAL CLINICAL IMPRESSION(S) / ED DIAGNOSES  Final diagnoses:  Midline low back pain without sciatica     Lissa Hoard, PA-C 03/20/15 1603  Myrna Blazer, MD 03/20/15 [phone]

## 2015-03-20 NOTE — ED Notes (Signed)
Pt with chronic back pain and here today for same.

## 2015-04-25 ENCOUNTER — Emergency Department
Admission: EM | Admit: 2015-04-25 | Discharge: 2015-04-26 | Disposition: A | Discharge status: Home / Self Care | Payer: Worker's Compensation | Attending: Emergency Medicine | Admitting: Emergency Medicine

## 2015-04-25 DIAGNOSIS — Z792 Long term (current) use of antibiotics: Secondary | ICD-10-CM | POA: Insufficient documentation

## 2015-04-25 DIAGNOSIS — Y990 Civilian activity done for income or pay: Secondary | ICD-10-CM | POA: Insufficient documentation

## 2015-04-25 DIAGNOSIS — Y9289 Other specified places as the place of occurrence of the external cause: Secondary | ICD-10-CM | POA: Diagnosis not present

## 2015-04-25 DIAGNOSIS — T25212A Burn of second degree of left ankle, initial encounter: Secondary | ICD-10-CM | POA: Insufficient documentation

## 2015-04-25 DIAGNOSIS — X118XXA Contact with other hot tap-water, initial encounter: Secondary | ICD-10-CM | POA: Diagnosis not present

## 2015-04-25 DIAGNOSIS — Z79899 Other long term (current) drug therapy: Secondary | ICD-10-CM | POA: Insufficient documentation

## 2015-04-25 DIAGNOSIS — Z791 Long term (current) use of non-steroidal anti-inflammatories (NSAID): Secondary | ICD-10-CM | POA: Diagnosis not present

## 2015-04-25 DIAGNOSIS — T25211A Burn of second degree of right ankle, initial encounter: Secondary | ICD-10-CM | POA: Diagnosis not present

## 2015-04-25 DIAGNOSIS — T25219A Burn of second degree of unspecified ankle, initial encounter: Secondary | ICD-10-CM

## 2015-04-25 DIAGNOSIS — F17210 Nicotine dependence, cigarettes, uncomplicated: Secondary | ICD-10-CM | POA: Diagnosis not present

## 2015-04-25 DIAGNOSIS — Y9389 Activity, other specified: Secondary | ICD-10-CM | POA: Insufficient documentation

## 2015-04-25 DIAGNOSIS — T25011A Burn of unspecified degree of right ankle, initial encounter: Secondary | ICD-10-CM | POA: Diagnosis present

## 2015-04-25 MED ORDER — SILVER SULFADIAZINE 1 % EX CREA
TOPICAL_CREAM | CUTANEOUS | Status: AC
Start: 2015-04-25 — End: 2016-04-24

## 2015-04-25 MED ORDER — OXYCODONE-ACETAMINOPHEN 5-325 MG PO TABS: 1.0000 | ORAL_TABLET | Freq: Four times a day (QID) | ORAL | Status: DC | PRN

## 2015-04-25 MED ORDER — SILVER SULFADIAZINE 1 % EX CREA
TOPICAL_CREAM | Freq: Once | CUTANEOUS | Status: AC
  Administered 2015-04-26: via TOPICAL
  Filled 2015-04-25: qty 85

## 2015-04-25 NOTE — Discharge Instructions (Signed)
Second-Degree Burn °A second-degree burn affects the 2 outer layers of skin. The outer layer (epidermis) and the layer underneath it (dermis) are both burned. Another name for this type of burn is a partial thickness burn. A second-degree burn may be called minor or major. This depends on the size of the burn. It also depends on what parts of the skin are burned. Minor burns may be treated with first aid. Major burns are a medical emergency. °A second-degree burn is worse than a first-degree burn, but not as bad as a third-degree burn. A first-degree burn affects only the epidermis. A third-degree burn goes through all the layers of skin. A second-degree burn usually heals in 3 to 4 weeks. A minor second-degree burn usually does not leave a scar. Deeper second-degree burns may lead to scarring of the skin or contractures over joints. Contractures are scars that form over joints and may lead to reduced mobility at those joints. °CAUSES °· Heat (thermal) injury. This happens when skin comes in contact with something very hot. It could be a flame, a hot object, hot liquid, or steam. Most second-degree burns are thermal injuries. °· Radiation. Sunlight is one type of radiation that can burn the skin. Another type of radiation is used to heat food. Radiation is also used to treat some diseases, such as cancer. All types of radiation can burn the skin. Sunlight usually causes a first-degree burn. Radiation used for heating food or treating a disease can cause a second-degree burn. °· Electricity. Electrical burns can cause more damage under the skin than on the surface. They should always be treated as major burns. °· Chemicals. Many chemicals can burn the skin. The burn should be flushed with cool water and checked by an emergency caregiver. °SYMPTOMS °Symptoms of second-degree burns include: °· Severe pain. °· Extreme tenderness. °· Deep redness. °· Blistered skin. °· Skin that has changed color. It might look blotchy,  wet, or shiny. °· Swelling. °TREATMENT °Some second-degree burns may need to be treated in a hospital. These include major burns, electrical burns, and chemical burns. Many other second-degree burns can be treated with regular first aid, such as: °· Cooling the burn. Use cool, germ-free (sterile) salt water. Place the burned area of skin into a tub of water, or cover the burned area with clean, wet towels. °· Taking pain medicine. °· Removing the dead skin from broken blisters. A trained caregiver may do this. Do not pop blisters. °· Gently washing your skin with mild soap. °· Covering the burned area with a cream. Silver sulfadiazine is a cream for burns. An antibiotic cream, such as bacitracin, may also be used to fight infection. Do not use other ointments or creams unless your caregiver says it is okay. °· Protecting the burn with a sterile, non-sticky bandage. °· Bandaging fingers and toes separately. This keeps them from sticking together. °· Taking an antibiotic. This can help prevent infection. °· Getting a tetanus shot. °HOME CARE INSTRUCTIONS °Medication °· Take any medicine prescribed by your caregiver. Follow the directions carefully. °· Ask your caregiver if you can take over-the-counter medicine to relieve pain and swelling. Do not give aspirin to children. °· Make sure your caregiver knows about all other medicines you take. This includes over-the-counter medicines. °Burn care °· You will need to change the bandage on your burn. You may need to do this 2 or 3 times each day. °¨ Gently clean the burned area. °¨ Put ointment on it. °¨ Cover the burn with a sterile bandage. °·   For some deeper burns or burns that cover a large area, compression garments may be prescribed. These garments can help minimize scarring and protect your mobility. °· Do not put butter or oil on your skin. Use only the cream prescribed by your caregiver. °· Do not put ice on your burn. °· Do not break blisters on your  skin. °· Keep the bandaged area dry. You might need to take a sponge bath for awhile. Ask your caregiver when you can take a shower or a tub bath again. °· Do not scratch an itchy burn. Your caregiver may give you medicine to relieve very bad itching. °· Infection is a big danger after a second-degree burn. Tell your caregiver right away if you have signs of infection, such as: °¨ Redness or changing color in the burned area. °¨ Fluid leaking from the burn. °¨ Swelling in the burn area. °¨ A bad smell coming from the wound. °Follow-up °· Keep all follow-up appointments. This is important. This is how your caregiver can tell if your treatment is working. °· Protect your burn from sunlight. Use sunscreen whenever you go outside. Burned areas may be sensitive to the sun for up to 1 year. Exposure to the sun may also cause permanent darkening of scars. °SEEK MEDICAL CARE IF: °· You have any questions about medicines. °· You have any questions about your treatment. °· You wonder if it is okay to do a particular activity. °· You develop a fever of more than 100.5° F (38.1° C). °SEEK IMMEDIATE MEDICAL CARE IF: °· You think your burn might be infected. It may change color, become red, leak fluid, swell, or smell bad. °· You develop a fever of more than 102° F (38.9° C). °  °This information is not intended to replace advice given to you by your health care provider. Make sure you discuss any questions you have with your health care provider. °  °Document Released: 07/26/2010 Document Revised: 05/16/2011 Document Reviewed: 07/26/2010 °Elsevier Interactive Patient Education ©2016 [employer] ° °Burn Care °Your skin is a natural barrier to infection. It is the largest organ of your body. Burns damage this natural protection. To help prevent infection, it is very important to follow your caregiver's instructions in the care of your burn. °Burns are classified as: °· First degree. There is only redness of the skin (erythema).  No scarring is expected. °· Second degree. There is blistering of the skin. Scarring may occur with deeper burns. °· Third degree. All layers of the skin are injured, and scarring is expected. °HOME CARE INSTRUCTIONS  °· Wash your hands well before changing your bandage. °· Change your bandage as often as directed by your caregiver. °¨ Remove the old bandage. If the bandage sticks, you may soak it off with cool, clean water. °¨ Cleanse the burn thoroughly but gently with mild soap and water. °¨ Pat the area dry with a clean, dry cloth. °¨ Apply a thin layer of antibacterial cream to the burn. °¨ Apply a clean bandage as instructed by your caregiver. °¨ Keep the bandage as clean and dry as possible. °· Elevate the affected area for the first 24 hours, then as instructed by your caregiver. °· Only take over-the-counter or prescription medicines for pain, discomfort, or fever as directed by your caregiver. °SEEK IMMEDIATE MEDICAL CARE IF:  °· You develop excessive pain. °· You develop redness, tenderness, swelling, or red streaks near the burn. °· The burned area develops yellowish-white fluid (pus) or a bad smell. °· You   have a fever. °MAKE SURE YOU:  °· Understand these instructions. °· Will watch your condition. °· Will get help right away if you are not doing well or get worse. °  °This information is not intended to replace advice given to you by your health care provider. Make sure you discuss any questions you have with your health care provider. °  °Document Released: 02/21/2005 Document Revised: 05/16/2011 Document Reviewed: 07/14/2010 °Elsevier Interactive Patient Education ©2016 [employer] ° °
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
## 2015-04-26 ENCOUNTER — Encounter: Payer: Self-pay | Admitting: Emergency Medicine

## 2015-04-26 NOTE — ED Notes (Signed)
Medication applied to bilateral wounds as ordered, telfa covering with gauze wrapping in place. Pt tolerated well.

## 2015-09-24 ENCOUNTER — Emergency Department
Admission: EM | Admit: 2015-09-24 | Discharge: 2015-09-24 | Disposition: A | Discharge status: Home / Self Care | Payer: Medicaid Other | Attending: Emergency Medicine | Admitting: Emergency Medicine

## 2015-09-24 ENCOUNTER — Emergency Department: Payer: Medicaid Other

## 2015-09-24 ENCOUNTER — Encounter: Payer: Self-pay | Admitting: Emergency Medicine

## 2015-09-24 DIAGNOSIS — Y999 Unspecified external cause status: Secondary | ICD-10-CM | POA: Diagnosis not present

## 2015-09-24 DIAGNOSIS — Y929 Unspecified place or not applicable: Secondary | ICD-10-CM | POA: Insufficient documentation

## 2015-09-24 DIAGNOSIS — J45909 Unspecified asthma, uncomplicated: Secondary | ICD-10-CM | POA: Insufficient documentation

## 2015-09-24 DIAGNOSIS — Y9389 Activity, other specified: Secondary | ICD-10-CM | POA: Insufficient documentation

## 2015-09-24 DIAGNOSIS — F17210 Nicotine dependence, cigarettes, uncomplicated: Secondary | ICD-10-CM | POA: Insufficient documentation

## 2015-09-24 DIAGNOSIS — S8992XA Unspecified injury of left lower leg, initial encounter: Secondary | ICD-10-CM | POA: Diagnosis present

## 2015-09-24 DIAGNOSIS — X501XXA Overexertion from prolonged static or awkward postures, initial encounter: Secondary | ICD-10-CM | POA: Insufficient documentation

## 2015-09-24 DIAGNOSIS — S93402A Sprain of unspecified ligament of left ankle, initial encounter: Secondary | ICD-10-CM | POA: Insufficient documentation

## 2015-09-24 MED ORDER — TRAMADOL HCL 50 MG PO TABS: 50.0000 mg | ORAL_TABLET | Freq: Four times a day (QID) | ORAL | Status: DC | PRN

## 2015-09-24 MED ORDER — NAPROXEN 500 MG PO TABS: 500.0000 mg | ORAL_TABLET | Freq: Two times a day (BID) | ORAL | Status: DC

## 2015-09-24 NOTE — Discharge Instructions (Signed)
Ankle Sprain  An ankle sprain is an injury to the strong, fibrous tissues (ligaments) that hold the bones of your ankle joint together.   CAUSES  An ankle sprain is usually caused by a fall or by twisting your ankle. Ankle sprains most commonly occur when you step on the outer edge of your foot, and your ankle turns inward. People who participate in sports are more prone to these types of injuries.   SYMPTOMS    Pain in your ankle. The pain may be present at rest or only when you are trying to stand or walk.   Swelling.   Bruising. Bruising may develop immediately or within 1 to 2 days after your injury.   Difficulty standing or walking, particularly when turning corners or changing directions.  DIAGNOSIS   Your caregiver will ask you details about your injury and perform a physical exam of your ankle to determine if you have an ankle sprain. During the physical exam, your caregiver will press on and apply pressure to specific areas of your foot and ankle. Your caregiver will try to move your ankle in certain ways. An X-ray exam may be done to be sure a bone was not broken or a ligament did not separate from one of the bones in your ankle (avulsion fracture).   TREATMENT   Certain types of braces can help stabilize your ankle. Your caregiver can make a recommendation for this. Your caregiver may recommend the use of medicine for pain. If your sprain is severe, your caregiver may refer you to a surgeon who helps to restore function to parts of your skeletal system (orthopedist) or a physical therapist.  HOME CARE INSTRUCTIONS    Apply ice to your injury for 1-2 days or as directed by your caregiver. Applying ice helps to reduce inflammation and pain.    Put ice in a plastic bag.    Place a towel between your skin and the bag.    Leave the ice on for 15-20 minutes at a time, every 2 hours while you are awake.   Only take over-the-counter or prescription medicines for pain, discomfort, or fever as directed by  your caregiver.   Elevate your injured ankle above the level of your heart as much as possible for 2-3 days.   If your caregiver recommends crutches, use them as instructed. Gradually put weight on the affected ankle. Continue to use crutches or a cane until you can walk without feeling pain in your ankle.   If you have a plaster splint, wear the splint as directed by your caregiver. Do not rest it on anything harder than a pillow for the first 24 hours. Do not put weight on it. Do not get it wet. You may take it off to take a shower or bath.   You may have been given an elastic bandage to wear around your ankle to provide support. If the elastic bandage is too tight (you have numbness or tingling in your foot or your foot becomes cold and blue), adjust the bandage to make it comfortable.   If you have an air splint, you may blow more air into it or let air out to make it more comfortable. You may take your splint off at night and before taking a shower or bath. Wiggle your toes in the splint several times per day to decrease swelling.  SEEK MEDICAL CARE IF:    You have rapidly increasing bruising or swelling.   Your toes feel   extremely cold or you lose feeling in your foot.   Your pain is not relieved with medicine.  SEEK IMMEDIATE MEDICAL CARE IF:   Your toes are numb or blue.   You have severe pain that is increasing.  MAKE SURE YOU:    Understand these instructions.   Will watch your condition.   Will get help right away if you are not doing well or get worse.     This information is not intended to replace advice given to you by your health care provider. Make sure you discuss any questions you have with your health care provider.     Document Released: 02/21/2005 Document Revised: 03/14/2014 Document Reviewed: 03/05/2011  Elsevier Interactive Patient Education 2016 [employer]vier Inc.

## 2015-09-24 NOTE — ED Notes (Signed)
States while playing with kids last night, twisted left ankle.  Today left ankle swollen, bruised and painful.

## 2015-09-24 NOTE — ED Notes (Signed)
See triage note  Left ankle has min swelling with some bruising noted   Positive pulses

## 2015-09-24 NOTE — ED Provider Notes (Signed)
[facility] Emergency Department Provider Note ____________________________________________  Time seen: Approximately 12:28 PM  I have reviewed the triage vital signs and the nursing notes.   HISTORY  Chief Complaint Ankle Pain    HPI Roberto Dudley is a 30 y.o. male who presents to the emergency department for evaluation of left ankle pain. While playing with his children last night, he twisted his left ankle. Today the ankle is swollen, bruised, and painful.He has not taken anything for pain prior to arrival.  Past Medical History  Diagnosis Date  . Asthma   . Back pain     There are no active problems to display for this patient.   Past Surgical History  Procedure Laterality Date  . Leg surgery    . Trhoat surgery    . Lung surgery    . Fracture surgery      Current Outpatient Rx  Name  Route  Sig  Dispense  Refill  . chlorhexidine (PERIDEX) 0.12 % solution      15 mL swish and spit bid   480 mL   0   . cyclobenzaprine (FLEXERIL) 5 MG tablet   Oral   Take 1 tablet (5 mg total) by mouth 3 (three) times daily as needed for muscle spasms.   15 tablet   0   . naproxen (NAPROSYN) 500 MG tablet   Oral   Take 1 tablet (500 mg total) by mouth 2 (two) times daily with a meal.   30 tablet   0   . penicillin v potassium (VEETID) 500 MG tablet   Oral   Take 1 tablet (500 mg total) by mouth 4 (four) times daily. X 10 days   40 tablet   0   . silver sulfADIAZINE (SILVADENE) 1 % cream      Apply to affected area daily   50 g   0   . traMADol (ULTRAM) 50 MG tablet   Oral   Take 1 tablet (50 mg total) by mouth every 6 (six) hours as needed.   12 tablet   0   . trimethoprim-polymyxin b (POLYTRIM) ophthalmic solution   Left Eye   Place 1 drop into the left eye every 4 (four) hours.   10 mL   0     Allergies Review of patient's allergies indicates no known allergies.  No family history on file.  Social History Social  History  Substance Use Topics  . Smoking status: Current Every Day Smoker -- 0.50 packs/day    Types: Cigarettes  . Smokeless tobacco: None  . Alcohol Use: Yes    Review of Systems Constitutional: No recent illness. Cardiovascular: Denies chest pain or palpitations. Respiratory: Denies shortness of breath. Musculoskeletal: Pain in left ankle. Skin: Negative for rash, wound, lesion. Neurological: Negative for focal weakness or numbness.  ____________________________________________   PHYSICAL EXAM:  VITAL SIGNS: ED Triage Vitals  Enc Vitals Group     BP 09/24/15 1137 120/75 mmHg     Pulse Rate 09/24/15 1137 97     Resp 09/24/15 1137 16     Temp 09/24/15 1137 98.2 F (36.8 C)     Temp Source 09/24/15 1137 Oral     SpO2 09/24/15 1137 100 %     Weight 09/24/15 1137 155 lb (70.308 kg)     Height 09/24/15 1137  (1.651 m)     Head Cir --      Peak Flow --      Pain  Score 09/24/15 1135 5     Pain Loc --      Pain Edu? --      Excl. in GC? --     Constitutional: Alert and oriented. Well appearing and in no acute distress. Eyes: Conjunctivae are normal. EOMI. Head: Atraumatic. Neck: No stridor.  Respiratory: Normal respiratory effort.   Vascular: 2+ DP and PT pulses Musculoskeletal: Tenderness noted over the left ankle with minimal swelling to the lateral malleolus in the ATFL pattern. Full range of motion. Neurologic:  Normal speech and language. No gross focal neurologic deficits are appreciated. Speech is normal. No gait instability. Skin:  Skin is warm, dry and intact. Atraumatic. Psychiatric: Mood and affect are normal. Speech and behavior are normal.  ____________________________________________   LABS (all labs ordered are listed, but only abnormal results are displayed)  Labs Reviewed - No data to display ____________________________________________  RADIOLOGY  Left ankle Negative for acute bony abnormality per  radiology. ____________________________________________   PROCEDURES  Procedure(s) performed: None   ____________________________________________   INITIAL IMPRESSION / ASSESSMENT AND PLAN / ED COURSE  Pertinent labs & imaging results that were available during my care of the patient were reviewed by me and considered in my medical decision making (see chart for details).  Patient was encouraged to reapply the lace up ankle splint that he was wearing upon arrival to the emergency department. He is also encouraged to continue using the crutches. He was advised to follow-up with orthopedics for symptoms that are not improving over the week. He was encouraged to return to the emergency department for symptoms change or worsen if he is unable schedule an appointment. ____________________________________________   FINAL CLINICAL IMPRESSION(S) / ED DIAGNOSES  Final diagnoses:  Ankle sprain, left, initial encounter       Roberto PesterCari B Kaylea Mounsey, FNP 09/24/15 1537  Jennye MoccasinBrian S Quigley, MD 09/24/15 [phone]

## 2016-02-13 ENCOUNTER — Encounter: Payer: Self-pay | Admitting: Emergency Medicine

## 2016-02-13 ENCOUNTER — Emergency Department
Admission: EM | Admit: 2016-02-13 | Discharge: 2016-02-13 | Disposition: A | Discharge status: Home / Self Care | Payer: Medicaid Other | Attending: Emergency Medicine | Admitting: Emergency Medicine

## 2016-02-13 DIAGNOSIS — Y929 Unspecified place or not applicable: Secondary | ICD-10-CM | POA: Diagnosis not present

## 2016-02-13 DIAGNOSIS — W268XXA Contact with other sharp object(s), not elsewhere classified, initial encounter: Secondary | ICD-10-CM | POA: Insufficient documentation

## 2016-02-13 DIAGNOSIS — Z79899 Other long term (current) drug therapy: Secondary | ICD-10-CM | POA: Diagnosis not present

## 2016-02-13 DIAGNOSIS — F17210 Nicotine dependence, cigarettes, uncomplicated: Secondary | ICD-10-CM | POA: Insufficient documentation

## 2016-02-13 DIAGNOSIS — J45909 Unspecified asthma, uncomplicated: Secondary | ICD-10-CM | POA: Diagnosis not present

## 2016-02-13 DIAGNOSIS — S61411A Laceration without foreign body of right hand, initial encounter: Secondary | ICD-10-CM | POA: Insufficient documentation

## 2016-02-13 DIAGNOSIS — Z23 Encounter for immunization: Secondary | ICD-10-CM | POA: Insufficient documentation

## 2016-02-13 DIAGNOSIS — Y9389 Activity, other specified: Secondary | ICD-10-CM | POA: Insufficient documentation

## 2016-02-13 DIAGNOSIS — Y999 Unspecified external cause status: Secondary | ICD-10-CM | POA: Diagnosis not present

## 2016-02-13 MED ORDER — LIDOCAINE HCL (PF) 1 % IJ SOLN
INTRAMUSCULAR | Status: AC
  Administered 2016-02-13: 5 mL
  Filled 2016-02-13: qty 5

## 2016-02-13 MED ORDER — TRAMADOL HCL 50 MG PO TABS: 50.0000 mg | ORAL_TABLET | Freq: Four times a day (QID) | ORAL | 0 refills | Status: AC | PRN

## 2016-02-13 MED ORDER — SULFAMETHOXAZOLE-TRIMETHOPRIM 800-160 MG PO TABS: 1.0000 | ORAL_TABLET | Freq: Two times a day (BID) | ORAL | 0 refills | Status: DC

## 2016-02-13 MED ORDER — TETANUS-DIPHTH-ACELL PERTUSSIS 5-2.5-18.5 LF-MCG/0.5 IM SUSP
0.5000 mL | Freq: Once | INTRAMUSCULAR | Status: AC
  Administered 2016-02-13: 0.5 mL via INTRAMUSCULAR
  Filled 2016-02-13: qty 0.5

## 2016-02-13 NOTE — ED Notes (Signed)
Pt cut R hand on hand saw while cutting wood. Tetanus 6 years ago. Lac inbetween thumb and pointer finger- at base of thumb. Bleeding controlled at this time.

## 2016-02-13 NOTE — ED Triage Notes (Signed)
States trying to cut wood last night and hit fold between thumb and index finger with saw, lac noted.

## 2016-02-13 NOTE — ED Provider Notes (Signed)
[facility] Emergency Department Provider Note   ____________________________________________   None    (approximate)  I have reviewed the triage vital signs and the nursing notes.   HISTORY  Chief Complaint Laceration    HPI Roberto Dudley is a 30 y.o. male patient by a laceration to the thenar fossa right hand. Patient states laceration secondary to a saw cut. Patient's last tetanus approximately 6-7 years ago. Patient denies loss sensation or loss of function of his finger. Instead occurred last night. No palliative measures except for direct pressure to control the bleeding. Patient is right-hand dominant. He should rates his pain as a 5/10. Patient describes pain as "sore".   Past Medical History:  Diagnosis Date  . Asthma   . Back pain     There are no active problems to display for this patient.   Past Surgical History:  Procedure Laterality Date  . FRACTURE SURGERY    . LEG SURGERY    . LUNG SURGERY    . trhoat surgery      Prior to Admission medications   Medication Sig Start Date End Date Taking? Authorizing Provider  chlorhexidine (PERIDEX) 0.12 % solution 15 mL swish and spit bid 02/25/15   Domenick GongAshley Mortenson, MD  cyclobenzaprine (FLEXERIL) 5 MG tablet Take 1 tablet (5 mg total) by mouth 3 (three) times daily as needed for muscle spasms. 03/20/15   Jenise V Bacon Menshew, PA-C  naproxen (NAPROSYN) 500 MG tablet Take 1 tablet (500 mg total) by mouth 2 (two) times daily with a meal. 09/24/15   Cari B Triplett, FNP  penicillin v potassium (VEETID) 500 MG tablet Take 1 tablet (500 mg total) by mouth 4 (four) times daily. X 10 days 02/25/15   Domenick GongAshley Mortenson, MD  silver sulfADIAZINE (SILVADENE) 1 % cream Apply to affected area daily 04/25/15 04/24/16  Rebecka ApleyAllison P Webster, MD  sulfamethoxazole-trimethoprim (BACTRIM DS,SEPTRA DS) 800-160 MG tablet Take 1 tablet by mouth 2 (two) times daily. 02/13/16   Joni Reiningonald K Leighanna Kirn, PA-C  traMADol (ULTRAM)  50 MG tablet Take 1 tablet (50 mg total) by mouth every 6 (six) hours as needed. 09/24/15   Chinita Pesterari B Triplett, FNP  traMADol (ULTRAM) 50 MG tablet Take 1 tablet (50 mg total) by mouth every 6 (six) hours as needed. 02/13/16 02/12/17  Joni Reiningonald K Carrah Eppolito, PA-C  trimethoprim-polymyxin b (POLYTRIM) ophthalmic solution Place 1 drop into the left eye every 4 (four) hours. 03/18/12   Johnsie Kindredarmen L Chatten, NP    Allergies Patient has no known allergies.  No family history on file.  Social History Social History  Substance Use Topics  . Smoking status: Current Every Day Smoker    Packs/day: 0.50    Types: Cigarettes  . Smokeless tobacco: Not on file  . Alcohol use Yes    Review of Systems Constitutional: No fever/chills Eyes: No visual changes. ENT: No sore throat. Cardiovascular: Denies chest pain. Respiratory: Denies shortness of breath. Gastrointestinal: No abdominal pain.  No nausea, no vomiting.  No diarrhea.  No constipation. Genitourinary: Negative for dysuria. Musculoskeletal: Negative for back pain. Skin: Negative for rash. Laceration thenar fossa right hand Neurological: Negative for headaches, focal weakness or numbness.  10-point ROS otherwise negative.  ____________________________________________   PHYSICAL EXAM:  VITAL SIGNS: ED Triage Vitals  Enc Vitals Group     BP 02/13/16 1308 (!) 149/101     Pulse Rate 02/13/16 1308 100     Resp 02/13/16 1308 20     Temp  02/13/16 1308 98.5 F (36.9 C)     Temp Source 02/13/16 1308 Oral     SpO2 02/13/16 1308 97 %     Weight 02/13/16 1308 160 lb (72.6 kg)     Height 02/13/16 1308 5\' 5"  (1.651 m)     Head Circumference --      Peak Flow --      Pain Score 02/13/16 1309 5     Pain Loc --      Pain Edu? --      Excl. in GC? --     Constitutional: Alert and oriented. Well appearing and in no acute distress. Eyes: Conjunctivae are normal. PERRL. EOMI. Head: Atraumatic. Nose: No congestion/rhinnorhea. Mouth/Throat: Mucous  membranes are moist.  Oropharynx non-erythematous. Neck: No stridor.  No cervical spine tenderness to palpation. Hematological/Lymphatic/Immunilogical: No cervical lymphadenopathy. Cardiovascular: Normal rate, regular rhythm. Grossly normal heart sounds.  Good peripheral circulation. Blood pressure Respiratory: Normal respiratory effort.  No retractions. Lungs CTAB. Gastrointestinal: Soft and nontender. No distention. No abdominal bruits. No CVA tenderness. Musculoskeletal: No lower extremity tenderness nor edema.  No joint effusions. Neurologic:  Normal speech and language. No gross focal neurologic deficits are appreciated. No gait instability. Skin:  Skin is warm, dry and intact. No rash noted. 2 cm laceration thenar fossa extending into the palm of the right hand. Psychiatric: Mood and affect are normal. Speech and behavior are normal.  ____________________________________________   LABS (all labs ordered are listed, but only abnormal results are displayed)  Labs Reviewed - No data to display ____________________________________________  EKG   ____________________________________________  RADIOLOGY   ____________________________________________   PROCEDURES  Procedure(s) performed: LACERATION REPAIR Performed by: Joni Reiningonald K Yazaira Speas Authorized by: Joni Reiningonald K Amoni Scallan Consent: Verbal consent obtained. Risks and benefits: risks, benefits and alternatives were discussed Consent given by: patient Patient identity confirmed: provided demographic data Prepped and Draped in normal sterile fashion Wound explored  Laceration Location: Thenar fossa right hand  Laceration Length: 2 cm  No Foreign Bodies seen or palpated  Anesthesia: local infiltration  Local anesthetic: lidocaine 1% without epinephrine  Anesthetic total: 5 ml  Irrigation method: syringe Amount of cleaning: standard  Skin closure: 3-0 nylon Number of sutures: 5   Technique: Interrupted Patient tolerance:  Patient tolerated the procedure well with no immediate complications.   Procedures  Critical Care performed: No  ____________________________________________   INITIAL IMPRESSION / ASSESSMENT AND PLAN / ED COURSE  Pertinent labs & imaging results that were available during my care of the patient were reviewed by me and considered in my medical decision making (see chart for details).  Laceration thenar fossa right hand. Patient given discharge care instructions. Patient given a prescription for tramadol and Bactrim DS. Patient advised to have sutures removed in 10 days by this department or family care clinic.  Clinical Course     Patient given a tetanus shot prior to departure.  ____________________________________________   FINAL CLINICAL IMPRESSION(S) / ED DIAGNOSES  Final diagnoses:  Laceration of right hand without foreign body, initial encounter      NEW MEDICATIONS STARTED DURING THIS VISIT:  New Prescriptions   SULFAMETHOXAZOLE-TRIMETHOPRIM (BACTRIM DS,SEPTRA DS) 800-160 MG TABLET    Take 1 tablet by mouth 2 (two) times daily.   TRAMADOL (ULTRAM) 50 MG TABLET    Take 1 tablet (50 mg total) by mouth every 6 (six) hours as needed.     Note:  This document was prepared using Dragon voice recognition software and may include unintentional dictation errors.  Joni Reining, PA-C 02/13/16 1417    Sharyn Creamer, MD 02/13/16 [id number]

## 2016-02-13 NOTE — Discharge Instructions (Signed)
May have sutures removed by urgent or family care clinic

## 2016-02-21 ENCOUNTER — Emergency Department
Admission: EM | Admit: 2016-02-21 | Discharge: 2016-02-21 | Disposition: A | Discharge status: Home / Self Care | Payer: Medicaid Other | Attending: Student in an Organized Health Care Education/Training Program | Admitting: Student in an Organized Health Care Education/Training Program

## 2016-02-21 ENCOUNTER — Encounter: Payer: Self-pay | Admitting: Emergency Medicine

## 2016-02-21 DIAGNOSIS — S61011D Laceration without foreign body of right thumb without damage to nail, subsequent encounter: Secondary | ICD-10-CM | POA: Insufficient documentation

## 2016-02-21 DIAGNOSIS — X58XXXD Exposure to other specified factors, subsequent encounter: Secondary | ICD-10-CM | POA: Insufficient documentation

## 2016-02-21 DIAGNOSIS — J45909 Unspecified asthma, uncomplicated: Secondary | ICD-10-CM | POA: Diagnosis not present

## 2016-02-21 DIAGNOSIS — Z79899 Other long term (current) drug therapy: Secondary | ICD-10-CM | POA: Insufficient documentation

## 2016-02-21 DIAGNOSIS — F17210 Nicotine dependence, cigarettes, uncomplicated: Secondary | ICD-10-CM | POA: Insufficient documentation

## 2016-02-21 DIAGNOSIS — Z791 Long term (current) use of non-steroidal anti-inflammatories (NSAID): Secondary | ICD-10-CM | POA: Insufficient documentation

## 2016-02-21 DIAGNOSIS — Z5189 Encounter for other specified aftercare: Secondary | ICD-10-CM

## 2016-02-21 DIAGNOSIS — Z4800 Encounter for change or removal of nonsurgical wound dressing: Secondary | ICD-10-CM | POA: Diagnosis present

## 2016-02-21 NOTE — ED Provider Notes (Signed)
[facility] Emergency Department Provider Note  ____________________________________________  Time seen: Approximately 6:44 PM  I have reviewed the triage vital signs and the nursing notes.   HISTORY  Chief Complaint Wound Check    HPI Roberto Dudley is a 30 y.o. male who presents emergency department for wound recheck. Patient had laceration to the interdigital space of hisright thumb 8 days prior. Patient states that he was at work tonight when one of the sutures busted. Patient is presenting to the emergency department for evaluation of this complaint. He denies any pain. He denies any signs of infection. No other injury or complaint. No medications prior to arrival. No pain at this time.   Past Medical History:  Diagnosis Date  . Asthma   . Back pain     There are no active problems to display for this patient.   Past Surgical History:  Procedure Laterality Date  . FRACTURE SURGERY    . LEG SURGERY    . LUNG SURGERY    . trhoat surgery      Prior to Admission medications   Medication Sig Start Date End Date Taking? Authorizing Provider  chlorhexidine (PERIDEX) 0.12 % solution 15 mL swish and spit bid 02/25/15   Domenick GongAshley Mortenson, MD  cyclobenzaprine (FLEXERIL) 5 MG tablet Take 1 tablet (5 mg total) by mouth 3 (three) times daily as needed for muscle spasms. 03/20/15   Jenise V Bacon Menshew, PA-C  naproxen (NAPROSYN) 500 MG tablet Take 1 tablet (500 mg total) by mouth 2 (two) times daily with a meal. 09/24/15   Cari B Triplett, FNP  penicillin v potassium (VEETID) 500 MG tablet Take 1 tablet (500 mg total) by mouth 4 (four) times daily. X 10 days 02/25/15   Domenick GongAshley Mortenson, MD  silver sulfADIAZINE (SILVADENE) 1 % cream Apply to affected area daily 04/25/15 04/24/16  Rebecka ApleyAllison P Webster, MD  sulfamethoxazole-trimethoprim (BACTRIM DS,SEPTRA DS) 800-160 MG tablet Take 1 tablet by mouth 2 (two) times daily. 02/13/16   Joni Reiningonald K Smith, PA-C  traMADol  (ULTRAM) 50 MG tablet Take 1 tablet (50 mg total) by mouth every 6 (six) hours as needed. 09/24/15   Chinita Pesterari B Triplett, FNP  traMADol (ULTRAM) 50 MG tablet Take 1 tablet (50 mg total) by mouth every 6 (six) hours as needed. 02/13/16 02/12/17  Joni Reiningonald K Smith, PA-C  trimethoprim-polymyxin b (POLYTRIM) ophthalmic solution Place 1 drop into the left eye every 4 (four) hours. 03/18/12   Johnsie Kindredarmen L Chatten, NP    Allergies Patient has no known allergies.  No family history on file.  Social History Social History  Substance Use Topics  . Smoking status: Current Every Day Smoker    Packs/day: 0.50    Types: Cigarettes  . Smokeless tobacco: Never Used  . Alcohol use Yes     Review of Systems  Constitutional: No fever/chills Cardiovascular: no chest pain. Respiratory: no cough. No SOB. Musculoskeletal: Negative for musculoskeletal pain. Skin: Positive for busted suture to suture laceration to the right hand Neurological: Negative for headaches, focal weakness or numbness. 10-point ROS otherwise negative.  ____________________________________________   PHYSICAL EXAM:  VITAL SIGNS: ED Triage Vitals  Enc Vitals Group     BP 02/21/16 1840 132/89     Pulse Rate 02/21/16 1840 98     Resp 02/21/16 1840 16     Temp 02/21/16 1840 98.1 F (36.7 C)     Temp Source 02/21/16 1840 Oral     SpO2 02/21/16 1840 97 %  Weight 02/21/16 1841 160 lb (72.6 kg)     Height 02/21/16 1841 5\' 5"  (1.651 m)     Head Circumference --      Peak Flow --      Pain Score 02/21/16 1841 7     Pain Loc --      Pain Edu? --      Excl. in GC? --      Constitutional: Alert and oriented. Well appearing and in no acute distress. Eyes: Conjunctivae are normal. PERRL. EOMI. Head: Atraumatic. Neck: No stridor.    Cardiovascular: Normal rate, regular rhythm. Normal S1 and S2.  Good peripheral circulation. Respiratory: Normal respiratory effort without tachypnea or retractions. Lungs CTAB. Good air entry to the bases  with no decreased or absent breath sounds. Musculoskeletal: Full range of motion to all extremities. No gross deformities appreciated. Neurologic:  Normal speech and language. No gross focal neurologic deficits are appreciated.  Skin:  Sutured laceration is noted to the interdigital space of the right thumb. Area is healing appropriately with no signs of infection. There is a busted suture. All of the sutures are intact. On exam, there does appear to be mild dehiscence to area of busted suture. Psychiatric: Mood and affect are normal. Speech and behavior are normal. Patient exhibits appropriate insight and judgement.   ____________________________________________   LABS (all labs ordered are listed, but only abnormal results are displayed)  Labs Reviewed - No data to display ____________________________________________  EKG   ____________________________________________  RADIOLOGY   No results found.  ____________________________________________    PROCEDURES  Procedure(s) performed:    .Suture Removal Date/Time: 02/21/2016 6:54 PM Performed by: Gala Romney D Authorized by: Gala Romney D   Consent:    Consent obtained:  Verbal   Consent given by:  Patient   Risks discussed:  Wound separation Location:    Location:  Upper extremity   Upper extremity location:  Hand   Hand location:  R thumb Procedure details:    Wound appearance:  No signs of infection   Number of sutures removed:  1 Post-procedure details:    Post-removal:  Steri-Strips applied   Patient tolerance of procedure:  Tolerated well, no immediate complications Comments:     Patient presents emergency Department with a ruptured suture. Underlying this injury, there does appear to be mild dehiscence. Sutures are not ready to be removed at this time. Wound was stabilized using Steri-Strips. Patient is given wound care instructions and instructions to follow-up in 3 days for complete suture  removal of all remaining sutures.      Medications - No data to display   ____________________________________________   INITIAL IMPRESSION / ASSESSMENT AND PLAN / ED COURSE  Pertinent labs & imaging results that were available during my care of the patient were reviewed by me and considered in my medical decision making (see chart for details).  Review of the [place] CSRS was performed in accordance of the [facility] prior to dispensing any controlled drugs.  Clinical Course     Patient's diagnosis is consistent with A wound check. Patient did have a ruptured suture. This was removed. Wound is stabilized using Steri-Strips. There was mild dehiscence from busted suture. Wound is stabilized using Steri-Strips and instructions are given to patient to follow-up in 3 days for suture removal. Patient will follow-up with urgent care for suture removal. No medications at this time.. Patient is given ED precautions to return to the ED for any worsening or new symptoms.  ____________________________________________  FINAL CLINICAL IMPRESSION(S) / ED DIAGNOSES  Final diagnoses:  Visit for wound check      NEW MEDICATIONS STARTED DURING THIS VISIT:  New Prescriptions   No medications on file        This chart was dictated using voice recognition software/Dragon. Despite best efforts to proofread, errors can occur which can change the meaning. Any change was purely unintentional.    Racheal PatchesJonathan D Cuthriell, PA-C 02/21/16 1856    Willy EddyPatrick Robinson, MD 02/21/16 2326

## 2016-02-21 NOTE — ED Triage Notes (Signed)
Pt comes into the ED via POV needing sutures rechecked from where they have come apart.  Patient in NAD at this time.

## 2016-08-27 ENCOUNTER — Emergency Department
Admission: EM | Admit: 2016-08-27 | Discharge: 2016-08-27 | Disposition: A | Discharge status: Home / Self Care | Payer: Medicaid Other | Attending: Emergency Medicine | Admitting: Emergency Medicine

## 2016-08-27 ENCOUNTER — Encounter: Payer: Self-pay | Admitting: Emergency Medicine

## 2016-08-27 DIAGNOSIS — F17210 Nicotine dependence, cigarettes, uncomplicated: Secondary | ICD-10-CM | POA: Insufficient documentation

## 2016-08-27 DIAGNOSIS — J45909 Unspecified asthma, uncomplicated: Secondary | ICD-10-CM | POA: Insufficient documentation

## 2016-08-27 DIAGNOSIS — S39012A Strain of muscle, fascia and tendon of lower back, initial encounter: Secondary | ICD-10-CM | POA: Insufficient documentation

## 2016-08-27 DIAGNOSIS — Y9389 Activity, other specified: Secondary | ICD-10-CM | POA: Insufficient documentation

## 2016-08-27 DIAGNOSIS — Z79899 Other long term (current) drug therapy: Secondary | ICD-10-CM | POA: Insufficient documentation

## 2016-08-27 DIAGNOSIS — Y999 Unspecified external cause status: Secondary | ICD-10-CM | POA: Insufficient documentation

## 2016-08-27 DIAGNOSIS — X500XXA Overexertion from strenuous movement or load, initial encounter: Secondary | ICD-10-CM | POA: Insufficient documentation

## 2016-08-27 DIAGNOSIS — G8929 Other chronic pain: Secondary | ICD-10-CM | POA: Insufficient documentation

## 2016-08-27 DIAGNOSIS — Y929 Unspecified place or not applicable: Secondary | ICD-10-CM | POA: Insufficient documentation

## 2016-08-27 MED ORDER — TRAMADOL HCL 50 MG PO TABS: 50.0000 mg | ORAL_TABLET | Freq: Four times a day (QID) | ORAL | 0 refills | Status: DC | PRN

## 2016-08-27 MED ORDER — CYCLOBENZAPRINE HCL 10 MG PO TABS: 10.0000 mg | ORAL_TABLET | Freq: Three times a day (TID) | ORAL | 0 refills | Status: DC | PRN

## 2016-08-27 NOTE — ED Notes (Signed)
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

## 2016-08-27 NOTE — ED Notes (Signed)
Pt verbalizes understanding of discharge instructions.

## 2016-08-27 NOTE — ED Provider Notes (Signed)
[facility] Emergency Department Provider Note   ____________________________________________   First MD Initiated Contact with Patient 08/27/16 1356     (approximate)  I have reviewed the triage vital signs and the nursing notes.   HISTORY  Chief Complaint Back Pain    HPI Roberto Dudley is a 31 y.o. 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 Patient has a history of chronic back pain with episodic flareups. Patient denies any radicular component to this back pain. Patient denies  bladder or bowel dysfunction.Patient rates his pain as a 8/10. Patient described a pain as "spasmodic and achy" no pulses measures for complaint.   Past Medical History:  Diagnosis Date  . Asthma   . Back pain     There are no active problems to display for this patient.   Past Surgical History:  Procedure Laterality Date  . FRACTURE SURGERY    . LEG SURGERY    . LUNG SURGERY    . trhoat surgery      Prior to Admission medications   Medication Sig Start Date End Date Taking? Authorizing Provider  chlorhexidine (PERIDEX) 0.12 % solution 15 mL swish and spit bid 02/25/15   Domenick Gong, MD  cyclobenzaprine (FLEXERIL) 10 MG tablet Take 1 tablet (10 mg total) by mouth 3 (three) times daily as needed. 08/27/16   Joni Reining, PA-C  cyclobenzaprine (FLEXERIL) 5 MG tablet Take 1 tablet (5 mg total) by mouth 3 (three) times daily as needed for muscle spasms. 03/20/15   Menshew, Charlesetta Ivory, PA-C  naproxen (NAPROSYN) 500 MG tablet Take 1 tablet (500 mg total) by mouth 2 (two) times daily with a meal. 09/24/15   Triplett, Cari B, FNP  penicillin v potassium (VEETID) 500 MG tablet Take 1 tablet (500 mg total) by mouth 4 (four) times daily. X 10 days 02/25/15   Domenick Gong, MD  sulfamethoxazole-trimethoprim (BACTRIM DS,SEPTRA DS) 800-160 MG tablet Take 1 tablet by mouth 2 (two)  times daily. 02/13/16   Joni Reining, PA-C  traMADol (ULTRAM) 50 MG tablet Take 1 tablet (50 mg total) by mouth every 6 (six) hours as needed. 09/24/15   Triplett, Rulon Eisenmenger B, FNP  traMADol (ULTRAM) 50 MG tablet Take 1 tablet (50 mg total) by mouth every 6 (six) hours as needed. 02/13/16 02/12/17  Joni Reining, PA-C  traMADol (ULTRAM) 50 MG tablet Take 1 tablet (50 mg total) by mouth every 6 (six) hours as needed for moderate pain. 08/27/16   Joni Reining, PA-C  trimethoprim-polymyxin b (POLYTRIM) ophthalmic solution Place 1 drop into the left eye every 4 (four) hours. 03/18/12   Johnsie Kindred, NP    Allergies Patient has no known allergies.  No family history on file.  Social History Social History  Substance Use Topics  . Smoking status: Current Every Day Smoker    Packs/day: 0.50    Types: Cigarettes  . Smokeless tobacco: Never Used  . Alcohol use Yes    Review of Systems  Constitutional: No fever/chills Eyes: No visual changes. ENT: No sore throat. Cardiovascular: Denies chest pain. Respiratory: Denies shortness of breath. Gastrointestinal: No abdominal pain.  No nausea, no vomiting.  No diarrhea.  No constipation. Genitourinary: Negative for dysuria. Musculoskeletal: Positive for back pain. Skin: Negative for rash. Neurological: Negative for headaches, focal weakness or numbness.   ____________________________________________   PHYSICAL EXAM:  VITAL SIGNS: ED Triage Vitals  Enc  Vitals Group     BP 08/27/16 1338 (!) 138/92     Pulse Rate 08/27/16 1338 94     Resp 08/27/16 1338 20     Temp 08/27/16 1338 98.4 F (36.9 C)     Temp Source 08/27/16 1338 Oral     SpO2 08/27/16 1338 98 %     Weight 08/27/16 1339 165 lb (74.8 kg)     Height 08/27/16 1339 5\' 5"  (1.651 m)     Head Circumference --      Peak Flow --      Pain Score 08/27/16 1338 8     Pain Loc --      Pain Edu? --      Excl. in GC? --     Constitutional: Alert and oriented. Well appearing and  in no acute distress. Neck: No stridor.  No cervical spine tenderness to palpation. Cardiovascular: Normal rate, regular rhythm. Grossly normal heart sounds.  Good peripheral circulation. Respiratory: Normal respiratory effort.  No retractions. Lungs CTAB. Musculoskeletal: Patient sits to stand up heavy reliance on upper extremity. No obvious spinal deformity. There is no CVA guarding. Patient has decreased range of motion with left lateral movements. Patient has right paraspinal muscle spasm with left lateral movements. Patient has a normal gait and a negative straight leg test bilaterally. No lower extremity tenderness nor edema.  No joint effusions. Neurologic:  Normal speech and language. No gross focal neurologic deficits are appreciated. No gait instability. Skin:  Skin is warm, dry and intact. No rash noted. Psychiatric: Mood and affect are normal. Speech and behavior are normal.  ____________________________________________   LABS (all labs ordered are listed, but only abnormal results are displayed)  Labs Reviewed - No data to display ____________________________________________  EKG   ____________________________________________  RADIOLOGY  No results found.  ____________________________________________   PROCEDURES  Procedure(s) performed: None  Procedures  Critical Care performed: No  ____________________________________________   INITIAL IMPRESSION / ASSESSMENT AND PLAN / ED COURSE  Pertinent labs & imaging results that were available during my care of the patient were reviewed by me and considered in my medical decision making (see chart for details).  Lumbar strain. Patient given discharge care instruction. Patient given a work no. Patient advised follow-up with PCP for continual care. Return by ER if condition worsens.      ____________________________________________   FINAL CLINICAL IMPRESSION(S) / ED DIAGNOSES  Final diagnoses:  Strain of  lumbar region, initial encounter      NEW MEDICATIONS STARTED DURING THIS VISIT:  New Prescriptions   CYCLOBENZAPRINE (FLEXERIL) 10 MG TABLET    Take 1 tablet (10 mg total) by mouth 3 (three) times daily as needed.   TRAMADOL (ULTRAM) 50 MG TABLET    Take 1 tablet (50 mg total) by mouth every 6 (six) hours as needed for moderate pain.     Note:  This document was prepared using Dragon voice recognition software and may include unintentional dictation errors.    Joni ReiningSmith, Audrey Eller K, PA-C 08/27/16 1408    Minna AntisPaduchowski, Kevin, MD 08/27/16 1436

## 2016-08-27 NOTE — ED Triage Notes (Signed)
Back pain since yesterday, history of same with no new injury.

## 2016-10-09 ENCOUNTER — Emergency Department
Admission: EM | Admit: 2016-10-09 | Discharge: 2016-10-09 | Disposition: A | Discharge status: Home / Self Care | Payer: Self-pay | Attending: Emergency Medicine | Admitting: Emergency Medicine

## 2016-10-09 ENCOUNTER — Emergency Department: Payer: Self-pay

## 2016-10-09 DIAGNOSIS — S0101XA Laceration without foreign body of scalp, initial encounter: Secondary | ICD-10-CM | POA: Insufficient documentation

## 2016-10-09 DIAGNOSIS — S0990XA Unspecified injury of head, initial encounter: Secondary | ICD-10-CM | POA: Insufficient documentation

## 2016-10-09 DIAGNOSIS — W010XXA Fall on same level from slipping, tripping and stumbling without subsequent striking against object, initial encounter: Secondary | ICD-10-CM | POA: Insufficient documentation

## 2016-10-09 DIAGNOSIS — Y939 Activity, unspecified: Secondary | ICD-10-CM | POA: Insufficient documentation

## 2016-10-09 DIAGNOSIS — F17210 Nicotine dependence, cigarettes, uncomplicated: Secondary | ICD-10-CM | POA: Insufficient documentation

## 2016-10-09 DIAGNOSIS — Y929 Unspecified place or not applicable: Secondary | ICD-10-CM | POA: Insufficient documentation

## 2016-10-09 DIAGNOSIS — Y998 Other external cause status: Secondary | ICD-10-CM | POA: Insufficient documentation

## 2016-10-09 NOTE — ED Provider Notes (Signed)
[facility] Emergency Department Provider Note   ____________________________________________   First MD Initiated Contact with Patient 10/09/16 1838     (approximate)  I have reviewed the triage vital signs and the nursing notes.   HISTORY  Chief Complaint Fall and Laceration    HPI Roberto Dudley is a 31 y.o. male patient percent of full laceration superior occipital aspect of the scalp secondary to a slip and fall. Patient denies LOC. Incident occurred yesterday morning. Patient states since the fall he is a mild vertigo. Patient denies vision disturbance.Patient rates his pain as a 10 over 10. Patient described the pain as "achy". No palliative measures for complaint. Patient state bleeding has resolved.   Past Medical History:  Diagnosis Date  . Asthma   . Back pain     There are no active problems to display for this patient.   Past Surgical History:  Procedure Laterality Date  . FRACTURE SURGERY    . LEG SURGERY    . LUNG SURGERY    . trhoat surgery      Prior to Admission medications   Medication Sig Start Date End Date Taking? Authorizing Provider  chlorhexidine (PERIDEX) 0.12 % solution 15 mL swish and spit bid 02/25/15   Domenick GongMortenson, Ashley, MD  cyclobenzaprine (FLEXERIL) 10 MG tablet Take 1 tablet (10 mg total) by mouth 3 (three) times daily as needed. 08/27/16   Joni ReiningSmith, Laurence Folz K, PA-C  cyclobenzaprine (FLEXERIL) 5 MG tablet Take 1 tablet (5 mg total) by mouth 3 (three) times daily as needed for muscle spasms. 03/20/15   Menshew, Charlesetta IvoryJenise V Bacon, PA-C  naproxen (NAPROSYN) 500 MG tablet Take 1 tablet (500 mg total) by mouth 2 (two) times daily with a meal. 09/24/15   Triplett, Cari B, FNP  penicillin v potassium (VEETID) 500 MG tablet Take 1 tablet (500 mg total) by mouth 4 (four) times daily. X 10 days 02/25/15   Domenick GongMortenson, Ashley, MD  sulfamethoxazole-trimethoprim (BACTRIM DS,SEPTRA DS) 800-160 MG tablet Take 1 tablet by mouth 2  (two) times daily. 02/13/16   Joni ReiningSmith, Alliyah Roesler K, PA-C  traMADol (ULTRAM) 50 MG tablet Take 1 tablet (50 mg total) by mouth every 6 (six) hours as needed. 09/24/15   Triplett, Rulon Eisenmengerari B, FNP  traMADol (ULTRAM) 50 MG tablet Take 1 tablet (50 mg total) by mouth every 6 (six) hours as needed. 02/13/16 02/12/17  Joni ReiningSmith, Zacari Radick K, PA-C  traMADol (ULTRAM) 50 MG tablet Take 1 tablet (50 mg total) by mouth every 6 (six) hours as needed for moderate pain. 08/27/16   Joni ReiningSmith, Landon Truax K, PA-C  trimethoprim-polymyxin b (POLYTRIM) ophthalmic solution Place 1 drop into the left eye every 4 (four) hours. 03/18/12   Johnsie Kindredhatten, Carmen L, NP    Allergies Patient has no known allergies.  No family history on file.  Social History Social History  Substance Use Topics  . Smoking status: Current Every Day Smoker    Packs/day: 0.50    Types: Cigarettes  . Smokeless tobacco: Never Used  . Alcohol use Yes    Review of Systems  Constitutional: No fever/chills Eyes: No visual changes. ENT: No sore throat. Cardiovascular: Denies chest pain. Respiratory: Denies shortness of breath. Gastrointestinal: No abdominal pain.  No nausea, no vomiting.  No diarrhea.  No constipation. Genitourinary: Negative for dysuria. Musculoskeletal: Negative for back pain. Skin: Negative for rash. Neurological: Positive for headaches, but denies focal weakness or numbness.   ____________________________________________   PHYSICAL EXAM:  VITAL SIGNS: ED Triage Vitals  Enc Vitals Group     BP 10/09/16 1509 (!) 145/83     Pulse Rate 10/09/16 1509 78     Resp 10/09/16 1509 16     Temp 10/09/16 1509 98.5 F (36.9 C)     Temp Source 10/09/16 1509 Oral     SpO2 10/09/16 1509 98 %     Weight --      Height --      Head Circumference --      Peak Flow --      Pain Score 10/09/16 1507 2     Pain Loc --      Pain Edu? --      Excl. in GC? --     Constitutional: Alert and oriented. Well appearing and in no acute distress. Eyes:  Conjunctivae are normal. PERRL. EOMI. Head: 3 cm laceration superior occipital area scalp. Cardiovascular: Normal rate, regular rhythm. Grossly normal heart sounds.  Good peripheral circulation. Respiratory: Normal respiratory effort.  No retractions. Lungs CTAB. Gastrointestinal: Soft and nontender. No distention. No abdominal bruits. No CVA tenderness. Neurologic:  Normal speech and language. No gross focal neurologic deficits are appreciated. No gait instability. Skin:  Skin is warm, dry and intact. No rash noted. 3 cm centimeters laceration superior occipital area scalp greater than 24 hours old. Psychiatric: Mood and affect are normal. Speech and behavior are normal.  ____________________________________________   LABS (all labs ordered are listed, but only abnormal results are displayed)  Labs Reviewed - No data to display ____________________________________________  EKG   ____________________________________________  RADIOLOGY  Ct Head Wo Contrast  Result Date: 10/09/2016 CLINICAL DATA:  Fall, head injury. Dizziness. Laceration to back of head. EXAM: CT HEAD WITHOUT CONTRAST TECHNIQUE: Contiguous axial images were obtained from the base of the skull through the vertex without intravenous contrast. COMPARISON:  Head CT dated 01/07/2004. FINDINGS: Brain: Ventricles are normal in size and configuration. There is no mass, hemorrhage, edema or other evidence of acute parenchymal abnormality. No extra-axial hemorrhage. Vascular: No hyperdense vessel or unexpected calcification. Skull: Normal. Negative for fracture or focal lesion. Sinuses/Orbits: No acute finding. Other: No soft tissue hematoma identified. IMPRESSION: Negative head CT. No intracranial hemorrhage or edema. No skull fracture. Electronically Signed   By: Bary RichardStan  Maynard M.D.   On: 10/09/2016 16:27    ____________________________________________   PROCEDURES  Procedure(s) performed: None  Procedures  Critical Care  performed: No  ____________________________________________   INITIAL IMPRESSION / ASSESSMENT AND PLAN / ED COURSE  Pertinent labs & imaging results that were available during my care of the patient were reviewed by me and considered in my medical decision making (see chart for details). Patient's percent with scalp laceration greater than 24 hours. Secondary to a slip and fall. There was no LOC.  Discussed negative CT findings with patient. Discussed rationale for not suturing scalp laceration. Patient given discharge Instructions return to work note.      ____________________________________________   FINAL CLINICAL IMPRESSION(S) / ED DIAGNOSES  Final diagnoses:  Injury of head, initial encounter      NEW MEDICATIONS STARTED DURING THIS VISIT:  Discharge Medication List as of 10/09/2016  6:46 PM       Note:  This document was prepared using Dragon voice recognition software and may include unintentional dictation errors.    Joni ReiningSmith, Dajiah Kooi K, PA-C 10/09/16 1900    Schaevitz, Myra Rudeavid Matthew, MD 10/09/16 2223

## 2016-10-09 NOTE — Discharge Instructions (Signed)
Advised Tylenol for headache and pain for the next 3-5 days.

## 2016-10-09 NOTE — ED Notes (Signed)
See triage note. Pt with lac head. A/o. Bleeding controlled

## 2016-10-09 NOTE — ED Triage Notes (Signed)
Pt states that he slipped on the wet porch and hit his head on the metal threshold. Pt denies LOC or blood thinners. Pt states that he has been feeling dizzy since hitting his head. Pt is A & O x 4. Pt states that he has a laceration to the back of the head. Bleeding is controlled at this time.

## 2017-10-23 ENCOUNTER — Encounter: Payer: Self-pay | Admitting: Emergency Medicine

## 2017-10-23 ENCOUNTER — Emergency Department
Admission: EM | Admit: 2017-10-23 | Discharge: 2017-10-23 | Disposition: A | Discharge status: Home / Self Care | Payer: Self-pay | Attending: Emergency Medicine | Admitting: Emergency Medicine

## 2017-10-23 ENCOUNTER — Other Ambulatory Visit: Payer: Self-pay

## 2017-10-23 DIAGNOSIS — M549 Dorsalgia, unspecified: Secondary | ICD-10-CM

## 2017-10-23 DIAGNOSIS — S39012A Strain of muscle, fascia and tendon of lower back, initial encounter: Secondary | ICD-10-CM | POA: Insufficient documentation

## 2017-10-23 DIAGNOSIS — Y939 Activity, unspecified: Secondary | ICD-10-CM | POA: Insufficient documentation

## 2017-10-23 DIAGNOSIS — Y999 Unspecified external cause status: Secondary | ICD-10-CM | POA: Insufficient documentation

## 2017-10-23 DIAGNOSIS — G8929 Other chronic pain: Secondary | ICD-10-CM | POA: Insufficient documentation

## 2017-10-23 DIAGNOSIS — X58XXXA Exposure to other specified factors, initial encounter: Secondary | ICD-10-CM | POA: Insufficient documentation

## 2017-10-23 DIAGNOSIS — Y929 Unspecified place or not applicable: Secondary | ICD-10-CM | POA: Insufficient documentation

## 2017-10-23 DIAGNOSIS — F17210 Nicotine dependence, cigarettes, uncomplicated: Secondary | ICD-10-CM | POA: Insufficient documentation

## 2017-10-23 DIAGNOSIS — J45909 Unspecified asthma, uncomplicated: Secondary | ICD-10-CM | POA: Insufficient documentation

## 2017-10-23 MED ORDER — CYCLOBENZAPRINE HCL 5 MG PO TABS: 5.0000 mg | ORAL_TABLET | Freq: Three times a day (TID) | ORAL | 0 refills | Status: DC | PRN

## 2017-10-23 NOTE — Discharge Instructions (Signed)
Your exam is consistent with lumbar strain and flare of your chronic back pain. Follow-up with your provider for further management. Select a primary provider after selecting an insurance plan. Take the prescription meds as directed.
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
## 2018-01-16 ENCOUNTER — Other Ambulatory Visit: Payer: Self-pay

## 2018-01-16 ENCOUNTER — Emergency Department
Admission: EM | Admit: 2018-01-16 | Discharge: 2018-01-16 | Disposition: A | Discharge status: Home / Self Care | Payer: Self-pay | Attending: Emergency Medicine | Admitting: Emergency Medicine

## 2018-01-16 ENCOUNTER — Emergency Department: Payer: Self-pay

## 2018-01-16 DIAGNOSIS — J45909 Unspecified asthma, uncomplicated: Secondary | ICD-10-CM | POA: Insufficient documentation

## 2018-01-16 DIAGNOSIS — R2240 Localized swelling, mass and lump, unspecified lower limb: Secondary | ICD-10-CM | POA: Insufficient documentation

## 2018-01-16 DIAGNOSIS — F17210 Nicotine dependence, cigarettes, uncomplicated: Secondary | ICD-10-CM | POA: Insufficient documentation

## 2018-01-16 DIAGNOSIS — N5089 Other specified disorders of the male genital organs: Secondary | ICD-10-CM

## 2018-01-16 DIAGNOSIS — N503 Cyst of epididymis: Secondary | ICD-10-CM | POA: Insufficient documentation

## 2018-01-16 MED ORDER — NAPROXEN 500 MG PO TABS
500.0000 mg | ORAL_TABLET | Freq: Once | ORAL | Status: AC
  Administered 2018-01-16: 500 mg via ORAL
  Filled 2018-01-16: qty 1

## 2018-01-16 MED ORDER — TRAMADOL HCL 50 MG PO TABS: 50.0000 mg | ORAL_TABLET | Freq: Four times a day (QID) | ORAL | 0 refills | Status: AC | PRN

## 2018-01-16 MED ORDER — SULFAMETHOXAZOLE-TRIMETHOPRIM 800-160 MG PO TABS: 1.0000 | ORAL_TABLET | Freq: Two times a day (BID) | ORAL | 0 refills | Status: DC

## 2018-01-16 MED ORDER — SULFAMETHOXAZOLE-TRIMETHOPRIM 800-160 MG PO TABS
1.0000 | ORAL_TABLET | Freq: Once | ORAL | Status: AC
  Administered 2018-01-16: 1 via ORAL
  Filled 2018-01-16: qty 1

## 2018-01-16 MED ORDER — TRAMADOL HCL 50 MG PO TABS
50.0000 mg | ORAL_TABLET | Freq: Once | ORAL | Status: AC
Start: 2018-01-16 — End: 2018-01-16
  Administered 2018-01-16: 50 mg via ORAL
  Filled 2018-01-16: qty 1

## 2018-01-16 MED ORDER — NAPROXEN 500 MG PO TABS: 500.0000 mg | ORAL_TABLET | Freq: Two times a day (BID) | ORAL | Status: DC

## 2018-01-16 NOTE — ED Provider Notes (Signed)
[facility] Emergency Department Provider Note   ____________________________________________   First MD Initiated Contact with Patient 01/16/18 1123     (approximate)  I have reviewed the triage vital signs and the nursing notes.   HISTORY  Chief Complaint Groin Swelling    HPI Roberto Dudley is a 32 y.o. male patient presents with scrotal pain, edema, erythema for few days.  Patient denies urethral discharge or dysuria.  Onset of complaint approximately 3 days.  Patient rates his pain as a 6/10.  Patient described the pain is "aching".  No palliative measure for complaint.   Past Medical History:  Diagnosis Date  . Asthma   . Back pain     There are no active problems to display for this patient.   Past Surgical History:  Procedure Laterality Date  . FRACTURE SURGERY    . LEG SURGERY    . LUNG SURGERY    . trhoat surgery      Prior to Admission medications   Medication Sig Start Date End Date Taking? Authorizing Provider  cyclobenzaprine (FLEXERIL) 5 MG tablet Take 1 tablet (5 mg total) by mouth 3 (three) times daily as needed for muscle spasms. 10/23/17   Menshew, Charlesetta Ivory, PA-C  naproxen (NAPROSYN) 500 MG tablet Take 1 tablet (500 mg total) by mouth 2 (two) times daily with a meal. 01/16/18   Joni Reining, PA-C  sulfamethoxazole-trimethoprim (BACTRIM DS,SEPTRA DS) 800-160 MG tablet Take 1 tablet by mouth 2 (two) times daily. 01/16/18   Joni Reining, PA-C  traMADol (ULTRAM) 50 MG tablet Take 1 tablet (50 mg total) by mouth every 6 (six) hours as needed. 01/16/18 01/16/19  Joni Reining, PA-C    Allergies Patient has no known allergies.  History reviewed. No pertinent family history.  Social History Social History   Tobacco Use  . Smoking status: Current Every Day Smoker    Packs/day: 0.50    Types: Cigarettes  . Smokeless tobacco: Never Used  Substance Use Topics  . Alcohol use: Yes  . Drug use: No     Review of Systems Constitutional: No fever/chills Eyes: No visual changes. ENT: No sore throat. Cardiovascular: Denies chest pain. Respiratory: Denies shortness of breath. Gastrointestinal: No abdominal pain.  No nausea, no vomiting.  No diarrhea.  No constipation. Genitourinary: Negative for dysuria.  Scrotum pain. Musculoskeletal: Negative for back pain. Skin: Negative for rash. Neurological: Negative for headaches, focal weakness or numbness.   ____________________________________________   PHYSICAL EXAM:  VITAL SIGNS: ED Triage Vitals [01/16/18 1115]  Enc Vitals Group     BP (!) 146/83     Pulse Rate 88     Resp 16     Temp 98.4 F (36.9 C)     Temp Source Oral     SpO2 99 %     Weight 165 lb (74.8 kg)     Height 5\' 5"  (1.651 m)     Head Circumference      Peak Flow      Pain Score 6     Pain Loc      Pain Edu?      Excl. in GC?    Constitutional: Alert and oriented. Well appearing and in no acute distress. Neck: No stridor.  Hematological/Lymphatic/Immunilogical: No cervical lymphadenopathy. Cardiovascular: Normal rate, regular rhythm. Grossly normal heart sounds.  Good peripheral circulation. Respiratory: Normal respiratory effort.  No retractions. Lungs CTAB. Genitourinary: Scrotal edema and erythema. Musculoskeletal: No lower extremity tenderness  nor edema.  No joint effusions. Neurologic:  Normal speech and language. No gross focal neurologic deficits are appreciated. No gait instability. Skin:  Skin is warm, dry and intact. No rash noted.  Scrotum erythema edema. Psychiatric: Mood and affect are normal. Speech and behavior are normal.  ____________________________________________   LABS (all labs ordered are listed, but only abnormal results are displayed)  Labs Reviewed - No data to display ____________________________________________  EKG   ____________________________________________  RADIOLOGY  ED MD interpretation:    Official  radiology report(s): US Scrotum  Result Date: 01/16/2018 CLINICAL DATA:  One week history of a left testicular mass EXAM: SCROTAL ULTRASOUND DOPPLER ULTRASOUND OF THE TESTICLES TECHNIQUE: Complete ultrasound examination of the testicles, epididymis, and other scrotal structures was performed. Color and spectral Doppler ultrasound were also utilized to evaluate blood flow to the testicles. COMPARISON:  None. FINDINGS: Right testicle Measurements: 4.4 x 1.8 x 2.4 cm. No mass or microlithiasis visualized. Left testicle Measurements: 4.7 x 1.3 x 2.5 cm. No mass or microlithiasis visualized. Right epididymis: There is a 5 mm diameter right epididymal cyst. The epididymis is otherwise normal in appearance. Left epididymis:  Normal in size and appearance. Hydrocele:  None visualized. Varicocele:  None visualized. Pulsed Doppler interrogation of both testes demonstrates normal low resistance arterial and venous waveforms bilaterally. IMPRESSION: The testes exhibit normal echotexture with no evidence of masses. Vascularity is normal. There is a 5 mm maximal dimension right epididymal cyst. Otherwise normal appearance of the right epididymis and left epididymis. Vascularity of the epididymal structures is normal. There is no hydrocele nor varicocele. No scrotal mass is observed. Electronically Signed   By: David  [place] M.D.   On: 01/16/2018 12:14   US Scrotum Doppler  Result Date: 01/16/2018 CLINICAL DATA:  One week history of a left testicular mass EXAM: SCROTAL ULTRASOUND DOPPLER ULTRASOUND OF THE TESTICLES TECHNIQUE: Complete ultrasound examination of the testicles, epididymis, and other scrotal structures was performed. Color and spectral Doppler ultrasound were also utilized to evaluate blood flow to the testicles. COMPARISON:  None. FINDINGS: Right testicle Measurements: 4.4 x 1.8 x 2.4 cm. No mass or microlithiasis visualized. Left testicle Measurements: 4.7 x 1.3 x 2.5 cm. No mass or microlithiasis  visualized. Right epididymis: There is a 5 mm diameter right epididymal cyst. The epididymis is otherwise normal in appearance. Left epididymis:  Normal in size and appearance. Hydrocele:  None visualized. Varicocele:  None visualized. Pulsed Doppler interrogation of both testes demonstrates normal low resistance arterial and venous waveforms bilaterally. IMPRESSION: The testes exhibit normal echotexture with no evidence of masses. Vascularity is normal. There is a 5 mm maximal dimension right epididymal cyst. Otherwise normal appearance of the right epididymis and left epididymis. Vascularity of the epididymal structures is normal. There is no hydrocele nor varicocele. No scrotal mass is observed. Electronically Signed   By: David  [place] M.D.   On: 01/16/2018 12:14    ____________________________________________   PROCEDURES  Procedure(s) performed: None  Procedures  Critical Care performed: No  ____________________________________________   INITIAL IMPRESSION / ASSESSMENT AND PLAN / ED COURSE  As part of my medical decision making, I reviewed the following data within the electronic MEDICAL RECORD NUMBER    Scrotum pain and swelling secondary to epididymal cysts andl staph infection.  Because ultrasound findings with patient.  Patient given discharge care instruction advised take medication as directed.  Patient advised follow-up with urologist with no improvement in 5 to 7 days.      ____________________________________________  FINAL CLINICAL IMPRESSION(S) / ED DIAGNOSES  Final diagnoses:  Testicular mass  Epididymal cyst     ED Discharge Orders         Ordered    traMADol (ULTRAM) 50 MG tablet  Every 6 hours PRN     01/16/18 1226    sulfamethoxazole-trimethoprim (BACTRIM DS,SEPTRA DS) 800-160 MG tablet  2 times daily     01/16/18 1226    naproxen (NAPROSYN) 500 MG tablet  2 times daily with meals     01/16/18 1226           Note:  This document was prepared  using Dragon voice recognition software and may include unintentional dictation errors.    Joni Reining, PA-C 01/16/18 1229    Emily Filbert, MD 01/19/18 [id number]

## 2018-01-16 NOTE — ED Notes (Signed)
See triage note. Pt has groin "boils" per pt. Reports one on left testicle has been there for about a week and is bigger than others he has had in the past.

## 2018-01-16 NOTE — ED Triage Notes (Signed)
Pt states testicle swelling and soreness. Sore today, swelling x few days. "unfornately I think it's 3 boils". Denies discharge and urinary symptoms.   A&O, ambulatory.

## 2018-04-10 ENCOUNTER — Emergency Department: Payer: Self-pay

## 2018-04-10 ENCOUNTER — Other Ambulatory Visit: Payer: Self-pay

## 2018-04-10 ENCOUNTER — Encounter: Payer: Self-pay | Admitting: Emergency Medicine

## 2018-04-10 ENCOUNTER — Emergency Department
Admission: EM | Admit: 2018-04-10 | Discharge: 2018-04-10 | Disposition: A | Discharge status: Home / Self Care | Payer: Self-pay | Attending: Emergency Medicine | Admitting: Emergency Medicine

## 2018-04-10 DIAGNOSIS — Z79899 Other long term (current) drug therapy: Secondary | ICD-10-CM | POA: Insufficient documentation

## 2018-04-10 DIAGNOSIS — N5089 Other specified disorders of the male genital organs: Secondary | ICD-10-CM

## 2018-04-10 DIAGNOSIS — F17210 Nicotine dependence, cigarettes, uncomplicated: Secondary | ICD-10-CM | POA: Insufficient documentation

## 2018-04-10 DIAGNOSIS — N503 Cyst of epididymis: Secondary | ICD-10-CM | POA: Insufficient documentation

## 2018-04-10 DIAGNOSIS — J45909 Unspecified asthma, uncomplicated: Secondary | ICD-10-CM | POA: Insufficient documentation

## 2018-04-10 DIAGNOSIS — N492 Inflammatory disorders of scrotum: Secondary | ICD-10-CM | POA: Insufficient documentation

## 2018-04-10 LAB — URINALYSIS, COMPLETE (UACMP) WITH MICROSCOPIC
BACTERIA UA: NONE SEEN
BILIRUBIN URINE: NEGATIVE
GLUCOSE, UA: NEGATIVE mg/dL
HGB URINE DIPSTICK: NEGATIVE
Ketones, ur: NEGATIVE mg/dL
Leukocytes, UA: NEGATIVE
NITRITE: NEGATIVE
PH: 6 (ref 5.0–8.0)
Protein, ur: NEGATIVE mg/dL
SPECIFIC GRAVITY, URINE: 1.011 (ref 1.005–1.030)

## 2018-04-10 LAB — CHLAMYDIA/NGC RT PCR (ARMC ONLY)
Chlamydia Tr: NOT DETECTED
N gonorrhoeae: NOT DETECTED

## 2018-04-10 MED ORDER — SULFAMETHOXAZOLE-TRIMETHOPRIM 800-160 MG PO TABS
1.0000 | ORAL_TABLET | Freq: Once | ORAL | Status: AC
  Administered 2018-04-10: 1 via ORAL
  Filled 2018-04-10: qty 1

## 2018-04-10 MED ORDER — SULFAMETHOXAZOLE-TRIMETHOPRIM 800-160 MG PO TABS: 1.0000 | ORAL_TABLET | Freq: Two times a day (BID) | ORAL | 0 refills | Status: DC

## 2018-04-10 MED ORDER — LIDOCAINE HCL (PF) 1 % IJ SOLN
5.0000 mL | Freq: Once | INTRAMUSCULAR | Status: AC
  Administered 2018-04-10: 5 mL via INTRADERMAL
  Filled 2018-04-10: qty 5

## 2018-04-10 NOTE — ED Notes (Signed)
See triage note  Presents with swelling to groin area since Friday  Denies any trauma or urinary sx's  States he has similar episode in past and dx'd with a cyst

## 2018-04-10 NOTE — ED Provider Notes (Signed)
[facility] Emergency Department Provider Note  ____________________________________________  Time seen: Approximately 7:46 AM  I have reviewed the triage vital signs and the nursing notes.   HISTORY  Chief Complaint Groin Swelling    HPI Roberto Dudley is a 33 y.o. male that presents to the emergency department for evaluation of right sided scrotal swelling for 4 days. Patient was here in November for similar. He was taking Bactrim, which improved symptoms but then returned after discontinuing antibiotics. Symptoms seem worse than they were in September.  No urinary symptoms. No fever, nausea, vomiting, abdominal pain.    Past Medical History:  Diagnosis Date  . Asthma   . Back pain     There are no active problems to display for this patient.   Past Surgical History:  Procedure Laterality Date  . FRACTURE SURGERY    . LEG SURGERY    . LUNG SURGERY    . trhoat surgery      Prior to Admission medications   Medication Sig Start Date End Date Taking? Authorizing Provider  cyclobenzaprine (FLEXERIL) 5 MG tablet Take 1 tablet (5 mg total) by mouth 3 (three) times daily as needed for muscle spasms. 10/23/17   Menshew, Charlesetta IvoryJenise V Bacon, PA-C  naproxen (NAPROSYN) 500 MG tablet Take 1 tablet (500 mg total) by mouth 2 (two) times daily with a meal. 01/16/18   Joni ReiningSmith, Ronald K, PA-C  sulfamethoxazole-trimethoprim (BACTRIM DS,SEPTRA DS) 800-160 MG tablet Take 1 tablet by mouth 2 (two) times daily. 04/10/18   Enid DerryWagner, Tywana Robotham, PA-C  traMADol (ULTRAM) 50 MG tablet Take 1 tablet (50 mg total) by mouth every 6 (six) hours as needed. 01/16/18 01/16/19  Joni ReiningSmith, Ronald K, PA-C    Allergies Patient has no known allergies.  No family history on file.  Social History Social History   Tobacco Use  . Smoking status: Current Every Day Smoker    Packs/day: 0.50    Types: Cigarettes  . Smokeless tobacco: Never Used  Substance Use Topics  . Alcohol use: Yes  .  Drug use: No     Review of Systems  Constitutional: No fever/chills Cardiovascular: No chest pain. Respiratory: No SOB. Gastrointestinal: No abdominal pain.  No nausea, no vomiting.  Genitourinary: Negative for dysuria. Musculoskeletal: Negative for musculoskeletal pain. Skin: Negative for abrasions, lacerations, ecchymosis.   ____________________________________________   PHYSICAL EXAM:  VITAL SIGNS: ED Triage Vitals  Enc Vitals Group     BP 04/10/18 0708 (!) 149/92     Pulse Rate 04/10/18 0708 87     Resp 04/10/18 0708 15     Temp 04/10/18 0708 97.7 F (36.5 C)     Temp Source 04/10/18 0708 Oral     SpO2 04/10/18 0708 100 %     Weight 04/10/18 0709 165 lb (74.8 kg)     Height 04/10/18 0709 5\' 5"  (1.651 m)     Head Circumference --      Peak Flow --      Pain Score 04/10/18 0714 6     Pain Loc --      Pain Edu? --      Excl. in GC? --      Constitutional: Alert and oriented. Well appearing and in no acute distress. Eyes: Conjunctivae are normal. PERRL. EOMI. Head: Atraumatic. ENT:      Ears:      Nose: No congestion/rhinnorhea.      Mouth/Throat: Mucous membranes are moist.  Neck: No stridor.  Cardiovascular: Normal rate, regular  rhythm.  Good peripheral circulation. Respiratory: Normal respiratory effort without tachypnea or retractions. Lungs CTAB. Good air entry to the bases with no decreased or absent breath sounds. Gastrointestinal: Bowel sounds 4 quadrants. Soft and nontender to palpation. No guarding or rigidity. No palpable masses. No distention. Genitourinary: PA student Dow Adolph present for exam and procedure.  Right testicle swollen and erythematous.  1 cm x 1 cm fluctuant area of swelling to right scrotum where thigh and scrotum meet. Musculoskeletal: Full range of motion to all extremities. No gross deformities appreciated. Neurologic:  Normal speech and language. No gross focal neurologic deficits are appreciated.  Skin:  Skin is warm, dry and  intact. No rash noted. Psychiatric: Mood and affect are normal. Speech and behavior are normal. Patient exhibits appropriate insight and judgement.   ____________________________________________   LABS (all labs ordered are listed, but only abnormal results are displayed)  Labs Reviewed  URINALYSIS, COMPLETE (UACMP) WITH MICROSCOPIC - Abnormal; Notable for the following components:      Result Value   Color, Urine STRAW (*)    APPearance CLEAR (*)    All other components within normal limits  CHLAMYDIA/NGC RT PCR ([facility] ONLY)   ____________________________________________  EKG   ____________________________________________  RADIOLOGY   US Scrotum  Result Date: 04/10/2018 CLINICAL DATA:  Right scrotal swelling for 5 days, atraumatic EXAM: SCROTAL ULTRASOUND DOPPLER ULTRASOUND OF THE TESTICLES TECHNIQUE: Complete ultrasound examination of the testicles, epididymis, and other scrotal structures was performed. Color and spectral Doppler ultrasound were also utilized to evaluate blood flow to the testicles. COMPARISON:  01/16/2018 FINDINGS: Right testicle Measurements: 46 x 21 x 24 mm.  No mass or abnormal vascularity. Left testicle Measurements:  41 x 20 x 24 mm.  No mass or abnormal vascularity. Right epididymis: Normal in size and vascularity. Incidental, simple 4 mm cyst Left epididymis:  Normal in size and vascularity. Hydrocele:  None visualized. Varicocele:  None visualized. Pulsed Doppler interrogation of both testes demonstrates normal low resistance arterial and venous waveforms bilaterally. IMPRESSION: No acute finding or explanation for pain. Electronically Signed   By: Marnee Spring M.D.   On: 04/10/2018 08:55   US Scrotum Doppler  Result Date: 04/10/2018 CLINICAL DATA:  Right scrotal swelling for 5 days, atraumatic EXAM: SCROTAL ULTRASOUND DOPPLER ULTRASOUND OF THE TESTICLES TECHNIQUE: Complete ultrasound examination of the testicles, epididymis, and other scrotal  structures was performed. Color and spectral Doppler ultrasound were also utilized to evaluate blood flow to the testicles. COMPARISON:  01/16/2018 FINDINGS: Right testicle Measurements: 46 x 21 x 24 mm.  No mass or abnormal vascularity. Left testicle Measurements:  41 x 20 x 24 mm.  No mass or abnormal vascularity. Right epididymis: Normal in size and vascularity. Incidental, simple 4 mm cyst Left epididymis:  Normal in size and vascularity. Hydrocele:  None visualized. Varicocele:  None visualized. Pulsed Doppler interrogation of both testes demonstrates normal low resistance arterial and venous waveforms bilaterally. IMPRESSION: No acute finding or explanation for pain. Electronically Signed   By: Marnee Spring M.D.   On: 04/10/2018 08:55    ____________________________________________    PROCEDURES  Procedure(s) performed:    Procedures  INCISION AND DRAINAGE Performed by: Enid Derry Consent: Verbal consent obtained. Risks and benefits: risks, benefits and alternatives were discussed Type: abscess  Body area: scrotum  Anesthesia: local infiltration  Incision was made with a scalpel.  Local anesthetic: lidocaine 1 % without epinephrine  Anesthetic total: 2 ml  Complexity: complex Blunt dissection to break up loculations  Drainage: purulent  Drainage amount: 4 ccs  Packing material: none  Patient tolerance: Patient tolerated the procedure well with no immediate complications.    Medications  lidocaine (PF) (XYLOCAINE) 1 % injection 5 mL (5 mLs Intradermal Given by Other 04/10/18 1022)  sulfamethoxazole-trimethoprim (BACTRIM DS,SEPTRA DS) 800-160 MG per tablet 1 tablet (1 tablet Oral Given 04/10/18 1027)     ____________________________________________   INITIAL IMPRESSION / ASSESSMENT AND PLAN / ED COURSE  Pertinent labs & imaging results that were available during my care of the patient were reviewed by me and considered in my medical decision making (see  chart for details).  Review of the [place] CSRS was performed in accordance of the [facility] prior to dispensing any controlled drugs.   Patient's diagnosis is consistent with scrotal abscess and epididymal cyst.  Ultrasound shows a right epididymal cyst.  Scrotal abscess was drained in the emergency department.  A very small incision was made due to location of abscess and it was not packed due to small incision.  Patient will be discharged home with prescriptions for Bactrim. Patient is to follow up with urology and primary care as directed. Patient is given ED precautions to return to the ED for any worsening or new symptoms.     ____________________________________________  FINAL CLINICAL IMPRESSION(S) / ED DIAGNOSES  Final diagnoses:  Scrotum swelling  Scrotal abscess  Epididymal cyst      NEW MEDICATIONS STARTED DURING THIS VISIT:  ED Discharge Orders         Ordered    sulfamethoxazole-trimethoprim (BACTRIM DS,SEPTRA DS) 800-160 MG tablet  2 times daily     04/10/18 1022              This chart was dictated using voice recognition software/Dragon. Despite best efforts to proofread, errors can occur which can change the meaning. Any change was purely unintentional.    Enid DerryWagner, Keagen Heinlen, PA-C 04/10/18 1041    Don PerkingVeronese, [place], MD 04/10/18 1452

## 2018-04-10 NOTE — ED Triage Notes (Signed)
Triaged by this RN. [phone] Note made by this RN.

## 2018-04-10 NOTE — ED Triage Notes (Signed)
Patient states he has swelling on the right side of his groin, states he was treated a couple of months ago with abx and it went away.  States he was told it was a cyst.  Denies discharge or dysuria.  States area has has been present for a couple of days.

## 2019-02-22 ENCOUNTER — Ambulatory Visit: Payer: Self-pay | Attending: Internal Medicine

## 2019-02-22 ENCOUNTER — Other Ambulatory Visit: Payer: Self-pay

## 2019-02-22 DIAGNOSIS — Z20822 Contact with and (suspected) exposure to covid-19: Secondary | ICD-10-CM

## 2019-02-23 LAB — NOVEL CORONAVIRUS, NAA: SARS-CoV-2, NAA: DETECTED — AB

## 2019-03-04 ENCOUNTER — Other Ambulatory Visit: Payer: Self-pay

## 2019-03-06 ENCOUNTER — Ambulatory Visit: Payer: Self-pay | Attending: Internal Medicine

## 2019-03-06 ENCOUNTER — Other Ambulatory Visit: Payer: Self-pay

## 2019-03-06 DIAGNOSIS — Z20822 Contact with and (suspected) exposure to covid-19: Secondary | ICD-10-CM

## 2019-03-06 DIAGNOSIS — Z20828 Contact with and (suspected) exposure to other viral communicable diseases: Secondary | ICD-10-CM | POA: Insufficient documentation

## 2019-03-07 LAB — NOVEL CORONAVIRUS, NAA: SARS-CoV-2, NAA: NOT DETECTED

## 2019-06-27 ENCOUNTER — Emergency Department
Admission: EM | Admit: 2019-06-27 | Discharge: 2019-06-27 | Disposition: A | Discharge status: Home / Self Care | Payer: Self-pay | Attending: Student | Admitting: Student

## 2019-06-27 ENCOUNTER — Emergency Department: Payer: Self-pay

## 2019-06-27 ENCOUNTER — Other Ambulatory Visit: Payer: Self-pay

## 2019-06-27 DIAGNOSIS — Y999 Unspecified external cause status: Secondary | ICD-10-CM | POA: Insufficient documentation

## 2019-06-27 DIAGNOSIS — Y9389 Activity, other specified: Secondary | ICD-10-CM | POA: Insufficient documentation

## 2019-06-27 DIAGNOSIS — W2209XA Striking against other stationary object, initial encounter: Secondary | ICD-10-CM | POA: Insufficient documentation

## 2019-06-27 DIAGNOSIS — Y92008 Other place in unspecified non-institutional (private) residence as the place of occurrence of the external cause: Secondary | ICD-10-CM | POA: Insufficient documentation

## 2019-06-27 DIAGNOSIS — F17210 Nicotine dependence, cigarettes, uncomplicated: Secondary | ICD-10-CM | POA: Insufficient documentation

## 2019-06-27 DIAGNOSIS — S9032XA Contusion of left foot, initial encounter: Secondary | ICD-10-CM | POA: Insufficient documentation

## 2019-06-27 MED ORDER — HYDROCODONE-ACETAMINOPHEN 5-325 MG PO TABS: 1.0000 | ORAL_TABLET | Freq: Four times a day (QID) | ORAL | 0 refills | Status: DC | PRN

## 2019-06-27 NOTE — Discharge Instructions (Signed)
Follow-up with your primary care provider or can no clinic acute care if any continued problems.  Ice and elevate for pain and swelling.  You may take over-the-counter anti-inflammatories for inflammation and pain.  A prescription for hydrocodone with acetaminophen was sent to the pharmacy in case the pain is keeping you awake this weekend.

## 2019-06-27 NOTE — ED Notes (Signed)
Pt states he hit left great toe on deep freezer last night. Pt declined ice pack. Left great toe swollen, large blue/purple bruise to top of left foot above left great toe. Pt states he cannot move that toe but other toes are fine.

## 2019-06-27 NOTE — ED Notes (Signed)
Pt bp elevated from arrival, EDP notified. Per EDP pt to follow up with primary care MD, pt states he will follow up with primary care.

## 2019-06-27 NOTE — ED Triage Notes (Signed)
Pt comes via POV from home with c/o left big toe pain. Pt states he slammed it into the freezer last night.  Pt unsure if it is broke.

## 2019-06-27 NOTE — ED Provider Notes (Signed)
[facility] Emergency Department Provider Note   ____________________________________________   First MD Initiated Contact with Patient 06/27/19 1337     (approximate)  I have reviewed the triage vital signs and the nursing notes.   HISTORY  Chief Complaint Toe Pain   HPI Roberto Dudley is a 34 y.o. male presents to the emergency department today with complaint of left great toe pain after he hit his freezer last p.m.  Patient has a bruise on the top of his left foot.  Patient states that it increases his pain to move his toes.  Patient continues to ambulate without any assistance.      Past Medical History:  Diagnosis Date  . Asthma   . Back pain     There are no problems to display for this patient.   Past Surgical History:  Procedure Laterality Date  . FRACTURE SURGERY    . LEG SURGERY    . LUNG SURGERY    . trhoat surgery      Prior to Admission medications   Medication Sig Start Date End Date Taking? Authorizing Provider  cyclobenzaprine (FLEXERIL) 5 MG tablet Take 1 tablet (5 mg total) by mouth 3 (three) times daily as needed for muscle spasms. 10/23/17   Menshew, Charlesetta Ivory, PA-C  HYDROcodone-acetaminophen (NORCO/VICODIN) 5-325 MG tablet Take 1 tablet by mouth every 6 (six) hours as needed for moderate pain. 06/27/19   Tommi Rumps, PA-C  naproxen (NAPROSYN) 500 MG tablet Take 1 tablet (500 mg total) by mouth 2 (two) times daily with a meal. 01/16/18   Joni Reining, PA-C  sulfamethoxazole-trimethoprim (BACTRIM DS,SEPTRA DS) 800-160 MG tablet Take 1 tablet by mouth 2 (two) times daily. 04/10/18   Enid Derry, PA-C    Allergies Sulfa antibiotics, Sulfites, Sulfonylureas, and Sulfur  No family history on file.  Social History Social History   Tobacco Use  . Smoking status: Current Every Day Smoker    Packs/day: 0.50    Types: Cigarettes  . Smokeless tobacco: Never Used  Substance Use Topics  . Alcohol use: Yes   . Drug use: No    Review of Systems Constitutional: No fever/chills Eyes: No visual changes. Cardiovascular: Denies chest pain. Respiratory: Denies shortness of breath. Gastrointestinal:  No nausea, no vomiting.   Musculoskeletal: Positive for left foot and great toe pain. Skin: Positive for bruise. Neurological: Negative for  focal weakness or numbness. ____________________________________________   PHYSICAL EXAM:  VITAL SIGNS: ED Triage Vitals  Enc Vitals Group     BP 06/27/19 1226 (!) 142/88     Pulse Rate 06/27/19 1226 95     Resp 06/27/19 1226 18     Temp 06/27/19 1226 98.4 F (36.9 C)     Temp src --      SpO2 06/27/19 1226 98 %     Weight 06/27/19 1223 165 lb (74.8 kg)     Height 06/27/19 1223 5\' 5"  (1.651 m)     Head Circumference --      Peak Flow --      Pain Score 06/27/19 1223 6     Pain Loc --      Pain Edu? --      Excl. in GC? --     Constitutional: Alert and oriented. Well appearing and in no acute distress. Eyes: Conjunctivae are normal.  Head: Atraumatic. Neck: No stridor.   Cardiovascular:  Good peripheral circulation. Respiratory: Normal respiratory effort.  No retractions. Musculoskeletal: No gross deformities  noted of the left foot however there is an ecchymotic area on the dorsal aspect near the left metatarsal.  Skin is intact.  Left great toe is tender without obvious deformity or edema.  Pulses present.  Range of motion in the digits are within normal limits.  Motor sensory function intact. Neurologic:  Normal speech and language. No gross focal neurologic deficits are appreciated.  Skin:  Skin is warm, dry and intact. No rash noted. Psychiatric: Mood and affect are normal. Speech and behavior are normal.  ____________________________________________   LABS (all labs ordered are listed, but only abnormal results are displayed)  Labs Reviewed - No data to display RADIOLOGY  Official radiology report(s): DG Toe Great Left  Result  Date: 06/27/2019 CLINICAL DATA:  Great toe bruising and swelling after injury last night. EXAM: LEFT GREAT TOE COMPARISON:  Left foot x-rays dated January 25, 2012. FINDINGS: There is no evidence of fracture or dislocation. There is no evidence of arthropathy or other focal bone abnormality. Soft tissues are unremarkable. IMPRESSION: Negative. Electronically Signed   By: Titus Dubin M.D.   On: 06/27/2019 14:44    ____________________________________________   PROCEDURES  Procedure(s) performed (including Critical Care):  Procedures   ____________________________________________   INITIAL IMPRESSION / ASSESSMENT AND PLAN / ED COURSE  As part of my medical decision making, I reviewed the following data within the electronic MEDICAL RECORD NUMBER Notes from prior ED visits and [place] Controlled Substance Database  34 year old male presents to the ED with complaint of left foot pain after he hit a freezer last evening.  He now has a large ecchymotic area to the dorsal aspect of his foot and pain to his left great toe.  X-rays were negative for acute bony injury.  Patient was made aware.  He will take anti-inflammatories over-the-counter.  A prescription for Norco was sent to his pharmacy in case he needs it for additional pain control.  He is instructed to ice and elevate to help with swelling and reduce pain.  He is to follow-up with his PCP if any continued problems.   ____________________________________________   FINAL CLINICAL IMPRESSION(S) / ED DIAGNOSES  Final diagnoses:  Contusion of left foot, initial encounter     ED Discharge Orders         Ordered    HYDROcodone-acetaminophen (NORCO/VICODIN) 5-325 MG tablet  Every 6 hours PRN     06/27/19 1455           Note:  This document was prepared using Dragon voice recognition software and may include unintentional dictation errors.    Johnn Hai, PA-C 06/27/19 1537    Lilia Pro., MD 06/27/19 [phone]

## 2019-06-29 IMAGING — US US SCROTUM W/ DOPPLER COMPLETE
1 series · 14 of 25 positions shown · non-contrast
Comparison: 01/16/2018

CLINICAL DATA: Right scrotal swelling for 5 days, atraumatic

EXAM:
SCROTAL ULTRASOUND
DOPPLER ULTRASOUND OF THE TESTICLES
TECHNIQUE: Complete ultrasound examination of the testicles, epididymis, and
other scrotal structures was performed. Color and spectral Doppler
ultrasound were also utilized to evaluate blood flow to the
testicles.

[Series 1: us scrotum w/ doppler complete · 0.06mm/px · 14 of 71 slices shown]
[im 1/71]
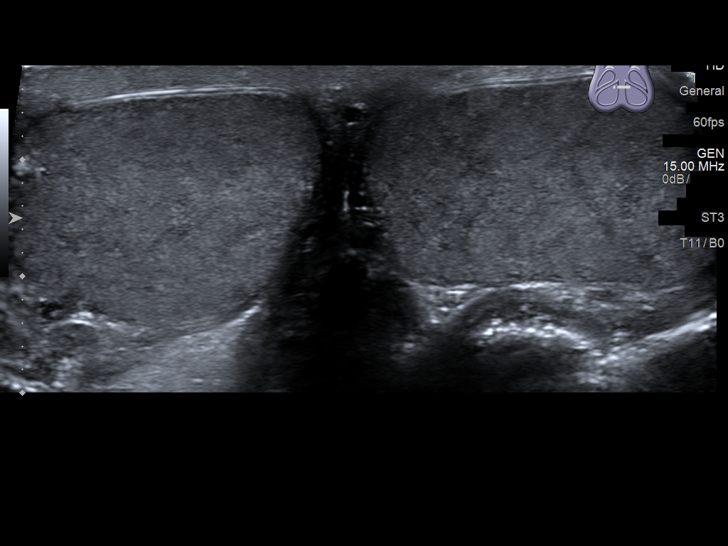
[im 6/71]
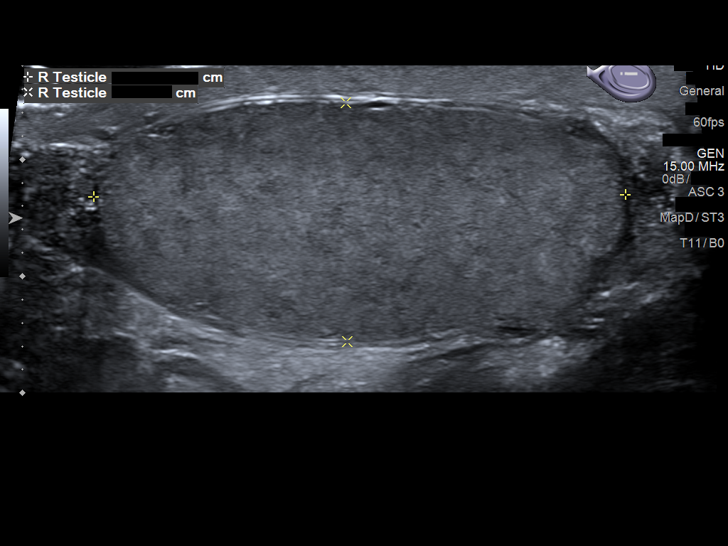
[im 12/71]
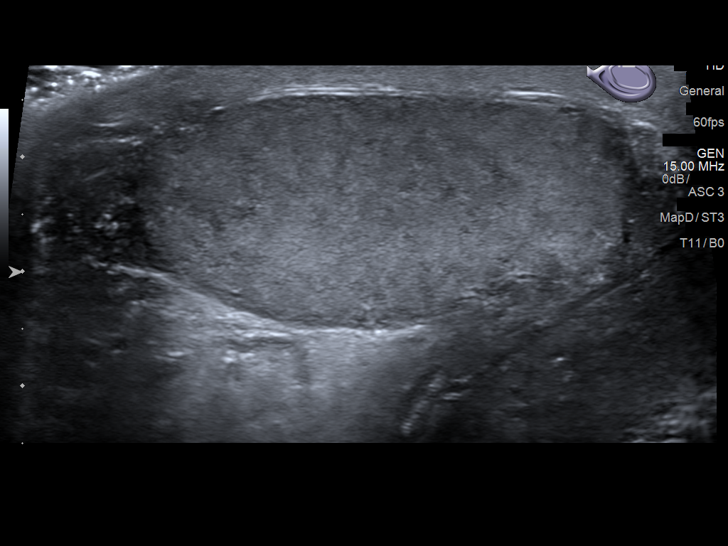
[im 18/71]
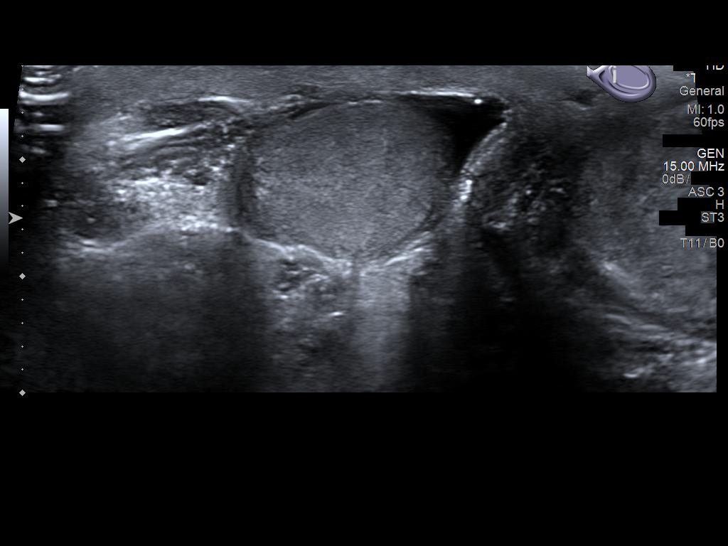
[im 24/71]
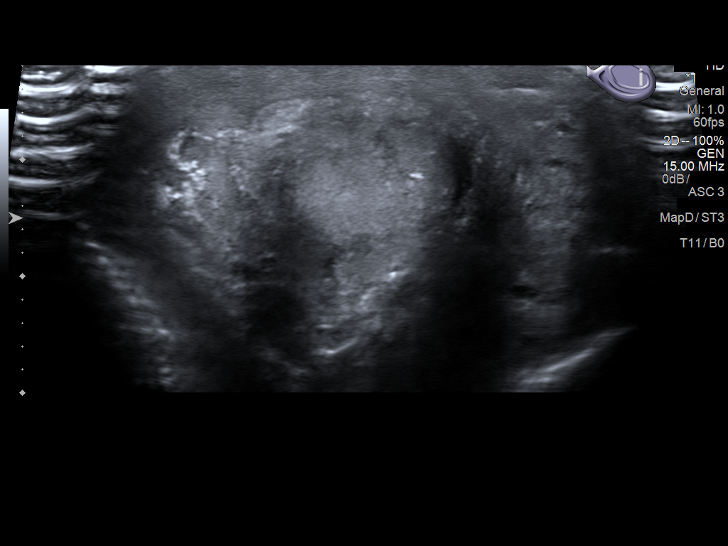
[im 27/71]
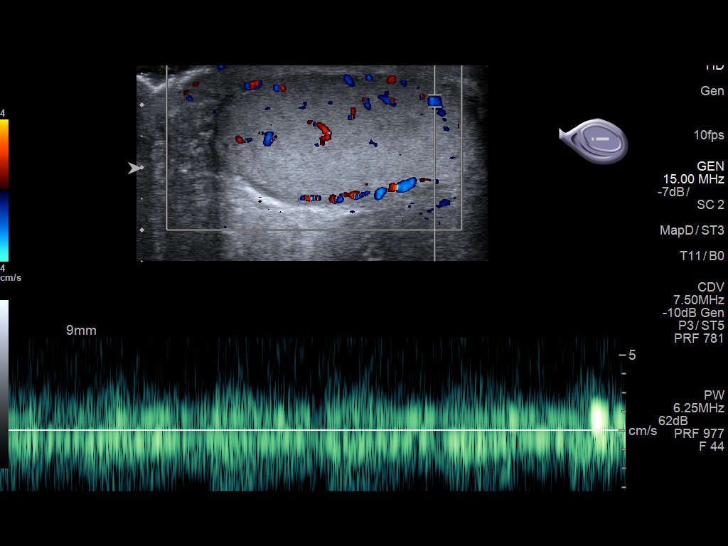
[im 33/71]
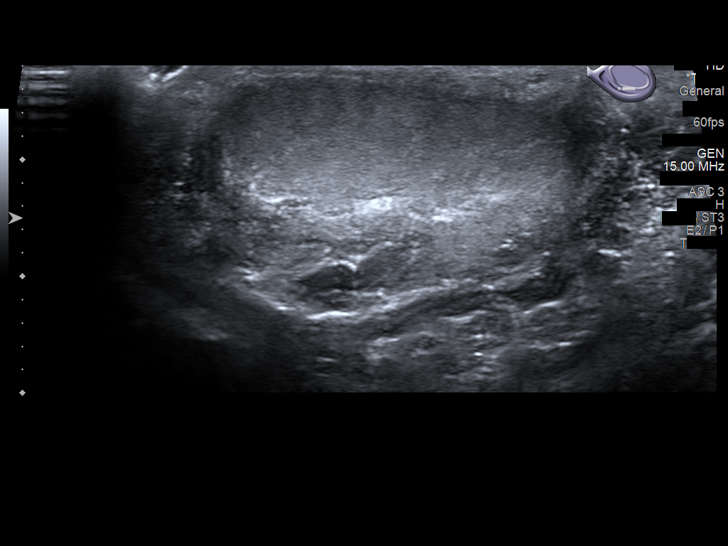
[im 38/71]
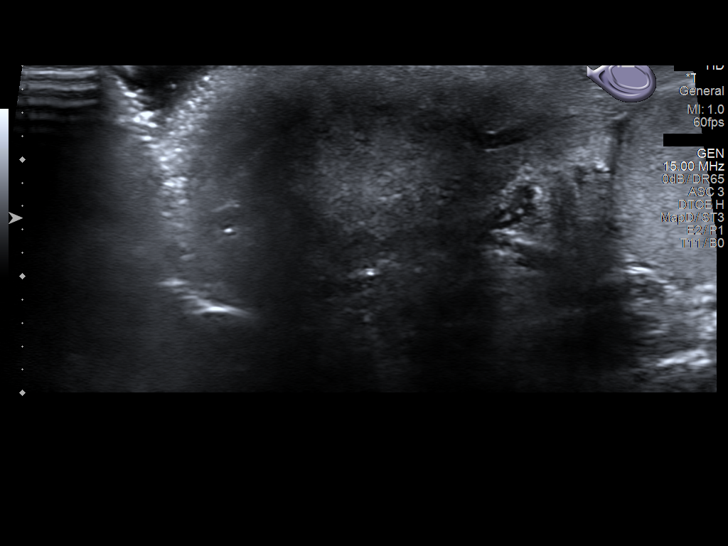
[im 44/71]
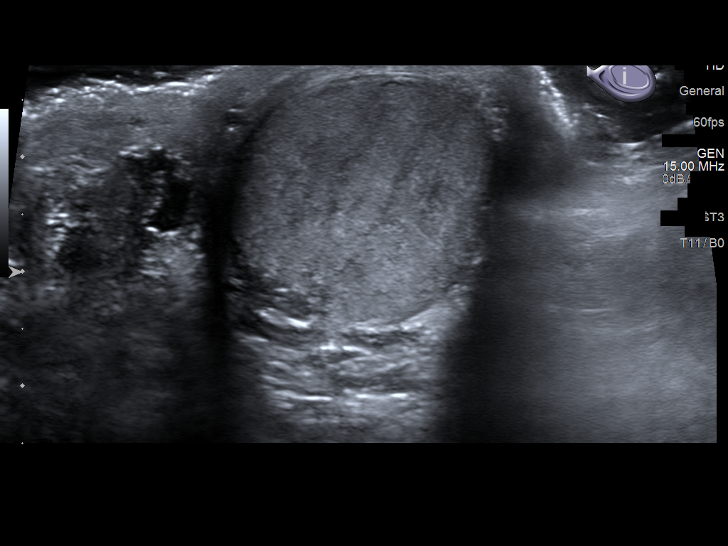
[im 47/71]
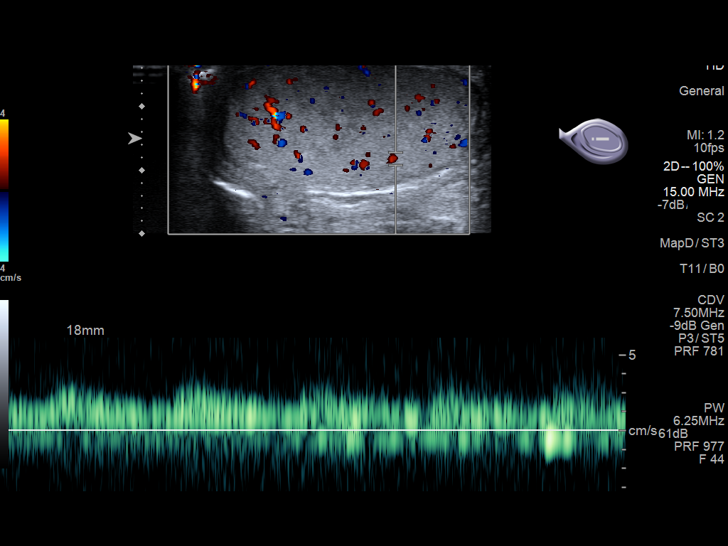
[im 53/71]
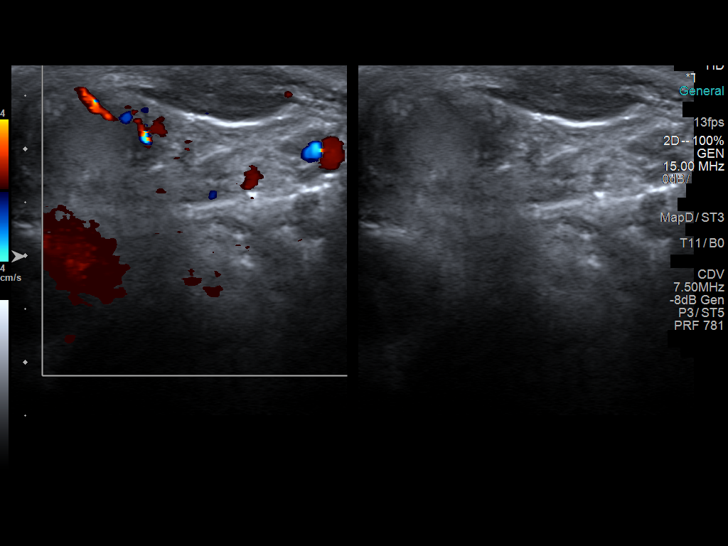
[im 59/71]
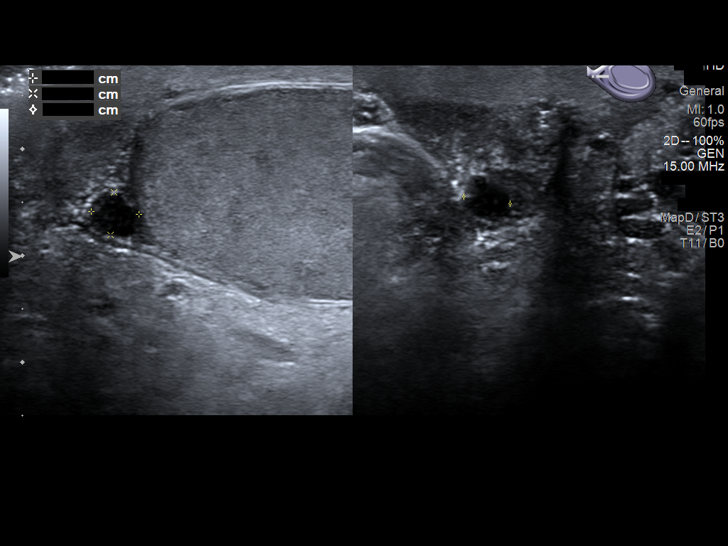
[im 65/71]
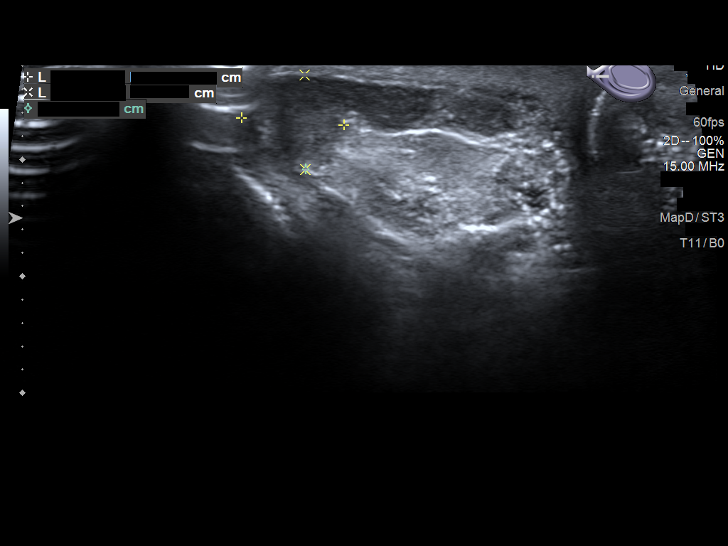
[im 71/71]
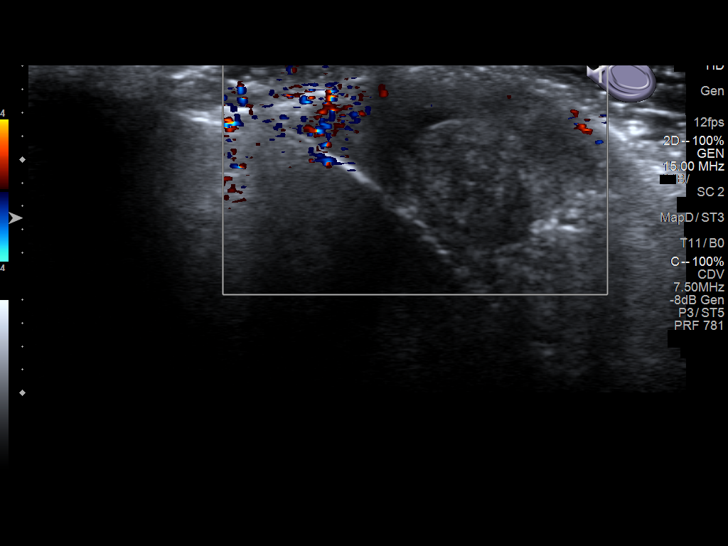

[14 of 25 positions shown; findings below may reference images not displayed]

FINDINGS: Right testicle

Measurements: 46 x 21 x 24 mm.  No mass or abnormal vascularity.

Left testicle

Measurements:  41 x 20 x 24 mm.  No mass or abnormal vascularity.

Right epididymis: Normal in size and vascularity. Incidental, simple
4 mm cyst

Left epididymis:  Normal in size and vascularity.

Hydrocele:  None visualized.

Varicocele:  None visualized.

Pulsed Doppler interrogation of both testes demonstrates normal low
resistance arterial and venous waveforms bilaterally.
IMPRESSION: No acute finding or explanation for pain.

## 2020-09-13 ENCOUNTER — Other Ambulatory Visit: Payer: Self-pay

## 2020-09-13 ENCOUNTER — Emergency Department
Admission: EM | Admit: 2020-09-13 | Discharge: 2020-09-13 | Disposition: A | Discharge status: Home / Self Care | Payer: Self-pay | Attending: Emergency Medicine | Admitting: Emergency Medicine

## 2020-09-13 ENCOUNTER — Emergency Department: Payer: Self-pay

## 2020-09-13 DIAGNOSIS — F17210 Nicotine dependence, cigarettes, uncomplicated: Secondary | ICD-10-CM | POA: Insufficient documentation

## 2020-09-13 DIAGNOSIS — R0781 Pleurodynia: Secondary | ICD-10-CM | POA: Insufficient documentation

## 2020-09-13 DIAGNOSIS — J45909 Unspecified asthma, uncomplicated: Secondary | ICD-10-CM | POA: Insufficient documentation

## 2020-09-13 NOTE — ED Triage Notes (Signed)
Pt states injury to lower left ribs a year ago has started hurting recently again.

## 2020-09-13 NOTE — Discharge Instructions (Signed)
Follow-up with your primary care provider or [facility] acute care if any continued problems.  To equal of the prescription strength take Aleve 2 tablets twice a day with food.  You may also obtain the lidocaine patch that is over-the-counter to place on your ribs which should give you relief of your rib pain without causing any drowsiness.

## 2020-09-13 NOTE — ED Provider Notes (Signed)
[facility] Emergency Department Provider Note  ____________________________________________   Event Date/Time   First MD Initiated Contact with Patient 09/13/20 [phone]     (approximate)  I have reviewed the triage vital signs and the nursing notes.   HISTORY  Chief Complaint Rib Injury   HPI Roberto Dudley is a 35 y.o. male presents to the ED with complaint of left-sided rib pain.  Patient denies any recent injury but states that a year ago he had an injury in which he landed backwards injuring his ribs.  Been taking Aleve once a day.  He rates his pain as 5 out of 10.       Past Medical History:  Diagnosis Date   Asthma    Back pain     There are no problems to display for this patient.   Past Surgical History:  Procedure Laterality Date   FRACTURE SURGERY     LEG SURGERY     LUNG SURGERY     trhoat surgery      Prior to Admission medications   Not on File    Allergies Elemental sulfur, Sulfa antibiotics, Sulfites, and Sulfonylureas  History reviewed. No pertinent family history.  Social History Social History   Tobacco Use   Smoking status: Every Day    Packs/day: 0.50    Pack years: 0.00    Types: Cigarettes   Smokeless tobacco: Never  Substance Use Topics   Alcohol use: Yes   Drug use: No    Review of Systems Constitutional: No fever/chills Eyes: No visual changes. Cardiovascular: Denies chest pain. Respiratory: Denies shortness of breath. Gastrointestinal: No abdominal pain.  No nausea, no vomiting.  Musculoskeletal: Left-sided rib pain. Skin: Negative for rash. Neurological: Negative for headaches, focal weakness or numbness.   ____________________________________________   PHYSICAL EXAM:  VITAL SIGNS: ED Triage Vitals  Enc Vitals Group     BP 09/13/20 0942 (!) 143/105     Pulse Rate 09/13/20 0942 88     Resp 09/13/20 0942 18     Temp 09/13/20 0942 98.1 F (36.7 C)     Temp Source 09/13/20 0942  Oral     SpO2 09/13/20 0942 99 %     Weight 09/13/20 0943 160 lb (72.6 kg)     Height 09/13/20 0943 5\' 4"  (1.626 m)     Head Circumference --      Peak Flow --      Pain Score 09/13/20 0943 5     Pain Loc --      Pain Edu? --      Excl. in GC? --     Constitutional: Alert and oriented. Well appearing and in no acute distress. Eyes: Conjunctivae are normal.  Head: Atraumatic. Neck: No stridor.   Cardiovascular: Normal rate, regular rhythm. Grossly normal heart sounds.  Good peripheral circulation. Respiratory: Normal respiratory effort.  No retractions. Lungs CTAB. Musculoskeletal: On examination of left chest there is no gross deformity and no soft tissue edema or discoloration noted.  There is some tenderness on palpation of the lower lateral ribs.  Patient is able move upper and lower extremities without difficulty.  Normal gait was noted. Neurologic:  Normal speech and language. No gross focal neurologic deficits are appreciated.  Skin:  Skin is warm, dry and intact. No rash noted. Psychiatric: Mood and affect are normal. Speech and behavior are normal.  ____________________________________________   LABS (all labs ordered are listed, but only abnormal results are displayed)  Labs  Reviewed - No data to display ____________________________________________  RADIOLOGY Beaulah Corin, personally viewed and evaluated these images (plain radiographs) as part of my medical decision making, as well as reviewing the written report by the radiologist.   Official radiology report(s): DG Ribs Unilateral W/Chest Left  Result Date: 09/13/2020 CLINICAL DATA:  LEFT rib pain. Injury to LEFT lower ribs a year ago, now hurting again. EXAM: LEFT RIBS AND CHEST - 3+ VIEW COMPARISON:  Chest x-ray dated 04/20/2004. FINDINGS: Heart size and mediastinal contours are within normal limits. Lungs are clear. No pleural effusion or pneumothorax is seen. Osseous structures about the chest are  unremarkable. No LEFT-sided rib fracture or lesion is identified. IMPRESSION: Negative. Electronically Signed   By: Bary Richard M.D.   On: 09/13/2020 10:20    ____________________________________________   PROCEDURES  Procedure(s) performed (including Critical Care):  Procedures   ____________________________________________   INITIAL IMPRESSION / ASSESSMENT AND PLAN / ED COURSE  As part of my medical decision making, I reviewed the following data within the electronic MEDICAL RECORD NUMBER Notes from prior ED visits and [place] Controlled Substance Database  35 year old male presents to the ED with complaint of left sided rib pain without recent injury.  Patient states that he did have an injury 1 year ago.  He has been taking naproxen over-the-counter 1 daily for his pain.  X-rays are negative for any acute bony changes.  Patient was made aware.  He will increase his naproxen to 2 tablets twice daily with food.  He is encouraged to follow-up with his PCP if any continued problems.  He is reassured that his x-rays were negative.  ____________________________________________   FINAL CLINICAL IMPRESSION(S) / ED DIAGNOSES  Final diagnoses:  Rib pain on left side     ED Discharge Orders     None        Note:  This document was prepared using Dragon voice recognition software and may include unintentional dictation errors.    Tommi Rumps, PA-C 09/13/20 1343    Phineas Semen, MD 09/13/20 1426

## 2020-09-13 NOTE — ED Notes (Signed)
See triage note  Presents with pain to left lateral rib area  States pain started couple of days ago  Denies any recent injury  But state shad a fall about 1 year ago

## 2020-10-30 ENCOUNTER — Emergency Department
Admission: EM | Admit: 2020-10-30 | Discharge: 2020-10-30 | Disposition: A | Discharge status: Home / Self Care | Payer: Self-pay | Attending: Emergency Medicine | Admitting: Emergency Medicine

## 2020-10-30 ENCOUNTER — Other Ambulatory Visit: Payer: Self-pay

## 2020-10-30 DIAGNOSIS — Z20822 Contact with and (suspected) exposure to covid-19: Secondary | ICD-10-CM | POA: Insufficient documentation

## 2020-10-30 DIAGNOSIS — J45909 Unspecified asthma, uncomplicated: Secondary | ICD-10-CM | POA: Insufficient documentation

## 2020-10-30 DIAGNOSIS — F17210 Nicotine dependence, cigarettes, uncomplicated: Secondary | ICD-10-CM | POA: Insufficient documentation

## 2020-10-30 DIAGNOSIS — R0981 Nasal congestion: Secondary | ICD-10-CM | POA: Insufficient documentation

## 2020-10-30 DIAGNOSIS — R059 Cough, unspecified: Secondary | ICD-10-CM | POA: Insufficient documentation

## 2020-10-30 DIAGNOSIS — Z28310 Unvaccinated for covid-19: Secondary | ICD-10-CM | POA: Insufficient documentation

## 2020-10-30 LAB — RESP PANEL BY RT-PCR (FLU A&B, COVID) ARPGX2
Influenza A by PCR: NEGATIVE
Influenza B by PCR: NEGATIVE
SARS Coronavirus 2 by RT PCR: NEGATIVE

## 2020-10-30 NOTE — Discharge Instructions (Signed)

## 2020-10-30 NOTE — ED Triage Notes (Signed)
Pt c/o cough since yesterday, states he needs a covid test for work

## 2020-10-30 NOTE — ED Provider Notes (Signed)
[facility] Emergency Department Provider Note  ____________________________________________   Event Date/Time   First MD Initiated Contact with Patient 10/30/20 [phone]     (approximate)  I have reviewed the triage vital signs and the nursing notes.   HISTORY  Chief Complaint URI    HPI Roberto Dudley is a 35 y.o. male presents to the emergency department with URI symptoms for 1 days.   Is complaining of cough, congestion, denies fever, chills, chest pain, shortness of breath close contact with Covid19+ patient, patient is not vaccinated.  Patient is a smoker.  His employer wants him to have a COVID test prior to returning to work   Past Medical History:  Diagnosis Date   Asthma    Back pain     There are no problems to display for this patient.   Past Surgical History:  Procedure Laterality Date   FRACTURE SURGERY     LEG SURGERY     LUNG SURGERY     trhoat surgery      Prior to Admission medications   Not on File    Allergies Elemental sulfur, Sulfa antibiotics, Sulfites, and Sulfonylureas  No family history on file.  Social History Social History   Tobacco Use   Smoking status: Every Day    Packs/day: 0.50    Types: Cigarettes   Smokeless tobacco: Never  Substance Use Topics   Alcohol use: Yes   Drug use: No    Review of Systems  Constitutional: No fever/chills Eyes: No visual changes. ENT: None sore throat. Respiratory: Positive cough Cardiovascular: Denies chest pain Gastrointestinal: Denies abdominal pain Genitourinary: Negative for dysuria. Musculoskeletal: Negative for back pain. Skin: Negative for rash. Neurological: Denies neurological changes    ____________________________________________   PHYSICAL EXAM:  VITAL SIGNS: ED Triage Vitals  Enc Vitals Group     BP 10/30/20 0939 (!) 144/94     Pulse Rate 10/30/20 0939 75     Resp 10/30/20 0939 16     Temp 10/30/20 0939 98.1 F (36.7 C)      Temp Source 10/30/20 0939 Oral     SpO2 10/30/20 0939 97 %     Weight --      Height --      Head Circumference --      Peak Flow --      Pain Score 10/30/20 0935 0     Pain Loc --      Pain Edu? --      Excl. in GC? --     Constitutional: Alert and oriented. Well appearing and in no acute distress. Eyes: Conjunctivae are normal.  Head: Atraumatic. Nose: No congestion/rhinnorhea. Mouth/Throat: Mucous membranes are moist.   Neck:  supple no lymphadenopathy noted Cardiovascular: Normal rate, regular rhythm. Heart sounds are normal Respiratory: Normal respiratory effort.  No retractions, lungs CTA GU: deferred Musculoskeletal: FROM all extremities, warm and well perfused Neurologic:  Normal speech and language.  Skin:  Skin is warm, dry and intact. No rash noted. Psychiatric: Mood and affect are normal. Speech and behavior are normal.  ____________________________________________   LABS (all labs ordered are listed, but only abnormal results are displayed)  Labs Reviewed  RESP PANEL BY RT-PCR (FLU A&B, COVID) [id number]   ____________________________________________   ____________________________________________  RADIOLOGY    ____________________________________________   PROCEDURES  Procedure(s) performed: No  Procedures    ____________________________________________   INITIAL IMPRESSION / ASSESSMENT AND PLAN / ED COURSE  Pertinent labs & imaging results that  were available during my care of the patient were reviewed by me and considered in my medical decision making (see chart for details).   Patient is a 35 year old male who complains of URI symptoms.  Exam is consistent with covid.    Pending test for covid   The patient was instructed to quarantine themselves at home.  Follow-up with your regular doctor if any concerns.  Return emergency department for worsening. OTC measures discussed     Roberto Dudley was evaluated in Emergency Department  on 10/30/2020 for the symptoms described in the history of present illness. He was evaluated in the context of the global COVID-19 pandemic, which necessitated consideration that the patient might be at risk for infection with the SARS-CoV-2 virus that causes COVID-19. Institutional protocols and algorithms that pertain to the evaluation of patients at risk for COVID-19 are in a state of rapid change based on information released by regulatory bodies including the CDC and federal and state organizations. These policies and algorithms were followed during the patient's care in the ED.   As part of my medical decision making, I reviewed the following data within the electronic MEDICAL RECORD NUMBER Nursing notes reviewed and incorporated, Old chart reviewed, Notes from prior ED visits, and [place] Controlled Substance Database  ____________________________________________   FINAL CLINICAL IMPRESSION(S) / ED DIAGNOSES  Final diagnoses:  Suspected COVID-19 virus infection      NEW MEDICATIONS STARTED DURING THIS VISIT:  New Prescriptions   No medications on file     Note:  This document was prepared using Dragon voice recognition software and may include unintentional dictation errors.    Faythe Ghee, PA-C 10/30/20 1002    Jene Every, MD 10/30/20 [phone]

## 2020-12-03 ENCOUNTER — Emergency Department
Admission: EM | Admit: 2020-12-03 | Discharge: 2020-12-03 | Disposition: A | Discharge status: Home / Self Care | Payer: Self-pay | Attending: Emergency Medicine | Admitting: Emergency Medicine

## 2020-12-03 ENCOUNTER — Other Ambulatory Visit: Payer: Self-pay

## 2020-12-03 ENCOUNTER — Emergency Department: Payer: Self-pay

## 2020-12-03 DIAGNOSIS — R0789 Other chest pain: Secondary | ICD-10-CM | POA: Insufficient documentation

## 2020-12-03 DIAGNOSIS — R059 Cough, unspecified: Secondary | ICD-10-CM | POA: Insufficient documentation

## 2020-12-03 DIAGNOSIS — J45909 Unspecified asthma, uncomplicated: Secondary | ICD-10-CM | POA: Insufficient documentation

## 2020-12-03 DIAGNOSIS — R0602 Shortness of breath: Secondary | ICD-10-CM | POA: Insufficient documentation

## 2020-12-03 DIAGNOSIS — R202 Paresthesia of skin: Secondary | ICD-10-CM | POA: Insufficient documentation

## 2020-12-03 DIAGNOSIS — F17210 Nicotine dependence, cigarettes, uncomplicated: Secondary | ICD-10-CM | POA: Insufficient documentation

## 2020-12-03 DIAGNOSIS — R079 Chest pain, unspecified: Secondary | ICD-10-CM

## 2020-12-03 LAB — CBC WITH DIFFERENTIAL/PLATELET
Abs Immature Granulocytes: 0.03 10*3/uL (ref 0.00–0.07)
Basophils Absolute: 0.1 10*3/uL (ref 0.0–0.1)
Basophils Relative: 1 %
Eosinophils Absolute: 0.3 10*3/uL (ref 0.0–0.5)
Eosinophils Relative: 3 %
HCT: 43.9 % (ref 39.0–52.0)
Hemoglobin: 14.9 g/dL (ref 13.0–17.0)
Immature Granulocytes: 0 %
Lymphocytes Relative: 29 %
Lymphs Abs: 2.7 10*3/uL (ref 0.7–4.0)
MCH: 31.3 pg (ref 26.0–34.0)
MCHC: 33.9 g/dL (ref 30.0–36.0)
MCV: 92.2 fL (ref 80.0–100.0)
Monocytes Absolute: 0.6 10*3/uL (ref 0.1–1.0)
Monocytes Relative: 7 %
Neutro Abs: 5.6 10*3/uL (ref 1.7–7.7)
Neutrophils Relative %: 60 %
Platelets: 253 10*3/uL (ref 150–400)
RBC: 4.76 MIL/uL (ref 4.22–5.81)
RDW: 12.4 % (ref 11.5–15.5)
WBC: 9.3 10*3/uL (ref 4.0–10.5)
nRBC: 0 % (ref 0.0–0.2)

## 2020-12-03 LAB — T4, FREE: Free T4: 0.78 ng/dL (ref 0.61–1.12)

## 2020-12-03 LAB — MAGNESIUM: Magnesium: 2.1 mg/dL (ref 1.7–2.4)

## 2020-12-03 LAB — COMPREHENSIVE METABOLIC PANEL
ALT: 29 U/L (ref 0–44)
AST: 24 U/L (ref 15–41)
Albumin: 4.6 g/dL (ref 3.5–5.0)
Alkaline Phosphatase: 70 U/L (ref 38–126)
Anion gap: 10 (ref 5–15)
BUN: 17 mg/dL (ref 6–20)
CO2: 23 mmol/L (ref 22–32)
Calcium: 10 mg/dL (ref 8.9–10.3)
Chloride: 108 mmol/L (ref 98–111)
Creatinine, Ser: 1 mg/dL (ref 0.61–1.24)
GFR, Estimated: >60 mL/min (ref >60)
Glucose, Bld: 89 mg/dL (ref 70–99)
Potassium: 4.1 mmol/L (ref 3.5–5.1)
Sodium: 141 mmol/L (ref 135–145)
Total Bilirubin: 0.8 mg/dL (ref 0.3–1.2)
Total Protein: 7.7 g/dL (ref 6.5–8.1)

## 2020-12-03 LAB — TROPONIN I (HIGH SENSITIVITY)
Troponin I (High Sensitivity): 3 ng/L (ref <18)
Troponin I (High Sensitivity): 4 ng/L (ref <18)

## 2020-12-03 LAB — BRAIN NATRIURETIC PEPTIDE: B Natriuretic Peptide: 6.5 pg/mL (ref 0.0–100.0)

## 2020-12-03 LAB — TSH: TSH: 0.808 u[IU]/mL (ref 0.350–4.500)

## 2020-12-03 NOTE — ED Provider Notes (Signed)
[facility] Emergency Department Provider Note  Time seen: 4:36 PM  I have reviewed the triage vital signs and the nursing notes.   HISTORY  Chief Complaint Chest Pain   HPI Roberto Dudley is a 35 y.o. male with a past medical history of asthma, presents to the emergency department for chest pain and left arm numbness/tingling.  According to the patient over the past 3 to 4 months he has been noticing intermittent tingling/discomfort in the left arm at times.  Patient states earlier today he developed some chest discomfort in the center of his chest.  Patient states mild shortness of breath and cough but states that is chronic as he is a smoker.  Denies any acute increase.  Denies any pleuritic pain.  No leg pain or swelling (more than chronic as he has chronic left leg pain due to prior surgeries).  Patient does state a family history of heart disease including his father who had a heart attack at a young age.  Denies any nausea or diaphoresis.  No fever.   Past Medical History:  Diagnosis Date   Asthma    Back pain     There are no problems to display for this patient.   Past Surgical History:  Procedure Laterality Date   FRACTURE SURGERY     LEG SURGERY     LUNG SURGERY     trhoat surgery      Prior to Admission medications   Not on File    Allergies  Allergen Reactions   Elemental Sulfur    Sulfa Antibiotics    Sulfites    Sulfonylureas     No family history on file.  Social History Social History   Tobacco Use   Smoking status: Every Day    Packs/day: 0.50    Types: Cigarettes   Smokeless tobacco: Never  Substance Use Topics   Alcohol use: Yes   Drug use: No    Review of Systems Constitutional: Negative for fever. Cardiovascular: Positive for chest pain earlier today, now resolved Respiratory: Mild shortness of breath and cough chronic due to smoking per patient.  No acute increase. Gastrointestinal: Negative for  abdominal pain, vomiting  Musculoskeletal: Negative for musculoskeletal complaints Neurological: Negative for headache All other ROS negative  ____________________________________________   PHYSICAL EXAM:  VITAL SIGNS: ED Triage Vitals  Enc Vitals Group     BP 12/03/20 1325 (!) 146/97     Pulse Rate 12/03/20 1325 89     Resp 12/03/20 1325 15     Temp 12/03/20 1325 98.4 F (36.9 C)     Temp Source 12/03/20 1325 Oral     SpO2 12/03/20 1325 97 %     Weight 12/03/20 1325 165 lb (74.8 kg)     Height 12/03/20 1325 5\' 5"  (1.651 m)     Head Circumference --      Peak Flow --      Pain Score 12/03/20 1325 3     Pain Loc --      Pain Edu? --      Excl. in GC? --    Constitutional: Alert and oriented. Well appearing and in no distress. Eyes: Normal exam ENT      Head: Normocephalic and atraumatic.      Mouth/Throat: Mucous membranes are moist. Cardiovascular: Normal rate, regular rhythm.  Respiratory: Normal respiratory effort without tachypnea nor retractions. Breath sounds are clear Gastrointestinal: Soft and nontender. No distention.  Musculoskeletal: Nontender with normal range of  motion in all extremities.  Neurologic:  Normal speech and language. No gross focal neurologic deficits  Skin:  Skin is warm, dry and intact.  Psychiatric: Mood and affect are normal.  ____________________________________________    EKG  EKG viewed and interpreted by myself shows a normal sinus rhythm 81 bpm with a slightly widened QRS, normal axis, normal intervals, nonspecific ST changes  ____________________________________________    RADIOLOGY  Chest x-ray is negative  ____________________________________________   INITIAL IMPRESSION / ASSESSMENT AND PLAN / ED COURSE  Pertinent labs & imaging results that were available during my care of the patient were reviewed by me and considered in my medical decision making (see chart for details).   Patient presents emergency department for  intermittent left upper extremity discomfort over the past 3 to 4 months as well as chest pain that started earlier today which is now resolved.  Patient's EKG and chest x-ray overall reassuring.  Physical exam is reassuring mildly hypertensive.  Lab work is reassuring including normal thyroid studies, negative troponin normal chemistry and CBC.  Repeat troponin is pending.  If the patient is repeat troponin is normal anticipate discharge home with PCP follow-up.  However given the patient's strong family history of cardiac disease we will refer to cardiology for a stress test.  Patient agreeable to plan of care.  Repeat troponin is negative.   Roberto Dudley was evaluated in Emergency Department on 12/03/2020 for the symptoms described in the history of present illness. He was evaluated in the context of the global COVID-19 pandemic, which necessitated consideration that the patient might be at risk for infection with the SARS-CoV-2 virus that causes COVID-19. Institutional protocols and algorithms that pertain to the evaluation of patients at risk for COVID-19 are in a state of rapid change based on information released by regulatory bodies including the CDC and federal and state organizations. These policies and algorithms were followed during the patient's care in the ED.  ____________________________________________   FINAL CLINICAL IMPRESSION(S) / ED DIAGNOSES  Chest pain   Minna Antis, MD 12/03/20 1656

## 2020-12-03 NOTE — ED Triage Notes (Signed)
Pt c/o chest pain that started today while at work with some SOB, pt states he has been having left arm numbness intermittently over the past 1-2 mo.. pt is a/ox, steady gait with NAD noted at this time.

## 2020-12-03 NOTE — ED Provider Notes (Addendum)
HPI: Pt is a 35 y.o. male who presents with complaints of chest pain  The patient p/w  numbness of left arm 1-2 months that comes and goes lasting a few hours. Then had Chest pain starting today.  + sob as well. No neck injuries.   ROS: Denies fever   Past Medical History:  Diagnosis Date   Asthma    Back pain    There were no vitals filed for this visit.  Focused Physical Exam: Gen: No acute distress Head: atraumatic, normocephalic Eyes: Extraocular movements grossly intact; conjunctiva clear CV: RRR Lung: No increased WOB, no stridor GI: ND, no obvious masses Neuro: Alert and awake, neuro exam intact other then some Left arm tingling.   Medical Decision Making and Plan: Given the patient's initial medical screening exam, the following diagnostic evaluation has been ordered. The patient will be placed in the appropriate treatment space, once one is available, to complete the evaluation and treatment. I have discussed the plan of care with the patient and I have advised the patient that an ED physician or mid-level practitioner will reevaluate their condition after the test results have been received, as the results may give them additional insight into the type of treatment they may need.   Diagnostics: labs, xray, ekg.   Treatments: none immediately   Concha Se, MD 12/03/20 1318    Concha Se, MD 12/03/20 1322

## 2021-02-10 ENCOUNTER — Emergency Department: Payer: Self-pay

## 2021-02-10 ENCOUNTER — Other Ambulatory Visit: Payer: Self-pay

## 2021-02-10 ENCOUNTER — Emergency Department
Admission: EM | Admit: 2021-02-10 | Discharge: 2021-02-10 | Disposition: A | Discharge status: Home / Self Care | Payer: Self-pay | Attending: Emergency Medicine | Admitting: Emergency Medicine

## 2021-02-10 DIAGNOSIS — S8001XA Contusion of right knee, initial encounter: Secondary | ICD-10-CM | POA: Insufficient documentation

## 2021-02-10 DIAGNOSIS — M545 Low back pain, unspecified: Secondary | ICD-10-CM | POA: Insufficient documentation

## 2021-02-10 DIAGNOSIS — Y9289 Other specified places as the place of occurrence of the external cause: Secondary | ICD-10-CM | POA: Insufficient documentation

## 2021-02-10 DIAGNOSIS — F17210 Nicotine dependence, cigarettes, uncomplicated: Secondary | ICD-10-CM | POA: Insufficient documentation

## 2021-02-10 DIAGNOSIS — W010XXA Fall on same level from slipping, tripping and stumbling without subsequent striking against object, initial encounter: Secondary | ICD-10-CM | POA: Insufficient documentation

## 2021-02-10 DIAGNOSIS — J45909 Unspecified asthma, uncomplicated: Secondary | ICD-10-CM | POA: Insufficient documentation

## 2021-02-10 NOTE — ED Notes (Signed)
Pt to ED for slipping on wet porch and hitting R knee. C/o burning pain in R knee and R big toe. Pt states pain is 9/10 when walking.

## 2021-02-10 NOTE — Discharge Instructions (Signed)
Your xray does not show any fractures of the knee.

## 2021-02-10 NOTE — ED Provider Notes (Signed)
[facility] Emergency Department Provider Note  ____________________________________________  Time seen: Approximately 1:26 PM  I have reviewed the triage vital signs and the nursing notes.   HISTORY  Chief Complaint Fall    HPI Roberto Dudley is a 35 y.o. male with a history of asthma who comes ED with right knee pain after a slip and fall.  He and his wife were try to walk into their house, and rainy weather made their porch slippery and they both fell.  He fell directly downward onto his right knee and has pain there.  He feels some paresthesia in his right great toe as well.  Also complains of some left lower back pain but is able to stand and walk.  Pains are constant since the fall, worse with movement, no alleviating factors.    Past Medical History:  Diagnosis Date   Asthma    Back pain      There are no problems to display for this patient.    Past Surgical History:  Procedure Laterality Date   FRACTURE SURGERY     LEG SURGERY     LUNG SURGERY     trhoat surgery       Prior to Admission medications   Not on File     Allergies Elemental sulfur, Sulfa antibiotics, Sulfites, and Sulfonylureas   No family history on file.  Social History Social History   Tobacco Use   Smoking status: Every Day    Packs/day: 0.50    Types: Cigarettes   Smokeless tobacco: Never  Substance Use Topics   Alcohol use: Yes   Drug use: No    Review of Systems  Constitutional:   No fever or chills.  ENT:   No sore throat. No rhinorrhea. Cardiovascular:   No chest pain or syncope. Respiratory:   No dyspnea or cough. Gastrointestinal:   Negative for abdominal pain, vomiting and diarrhea.  Musculoskeletal:   Left lower back pain, right knee pain as above All other systems reviewed and are negative except as documented above in ROS and HPI.  ____________________________________________   PHYSICAL EXAM:  VITAL SIGNS: ED Triage Vitals   Enc Vitals Group     BP 02/10/21 1056 (!) 149/113     Pulse Rate 02/10/21 1056 (!) 105     Resp 02/10/21 1056 16     Temp 02/10/21 1056 98.2 F (36.8 C)     Temp Source 02/10/21 1056 Oral     SpO2 02/10/21 1056 96 %     Weight --      Height --      Head Circumference --      Peak Flow --      Pain Score 02/10/21 1051 8     Pain Loc --      Pain Edu? --      Excl. in GC? --     Vital signs reviewed, nursing assessments reviewed.   Constitutional:   Alert and oriented. Non-toxic appearance. Eyes:   Conjunctivae are normal. EOMI. ENT      Head:   Normocephalic and atraumatic.      Mouth/Throat:   MMM      Neck:   No meningismus. Full ROM.  No midline tenderness Hematological/Lymphatic/Immunilogical:   No cervical lymphadenopathy. Cardiovascular:   RRR. Symmetric bilateral radial and DP pulses.  No murmurs. Cap refill less than 2 seconds. Respiratory:   Normal respiratory effort without tachypnea/retractions. Breath sounds are clear and equal bilaterally. No wheezes/rales/rhonchi.  Gastrointestinal:   Soft and nontender. Non distended. There is no CVA tenderness.  No rebound, rigidity, or guarding. Musculoskeletal:   Normal range of motion in all extremities.  No edema.  No midline spinal tenderness.  There is tenseness and tenderness in the musculature of the left lower back consistent with muscle strain.  There is bruising at the anterior right knee over the proximal tibia and some tenderness at the tibial tuberosity.  Knee is stable, no joint line tenderness. Neurologic:   Normal speech and language.  Motor grossly intact. No acute focal neurologic deficits are appreciated.  Skin:    Skin is warm, dry and intact. No rash noted.  No wounds.  ____________________________________________    LABS (pertinent positives/negatives) (all labs ordered are listed, but only abnormal results are displayed) Labs Reviewed - No data to  display ____________________________________________   EKG  ____________________________________________    RADIOLOGY  DG Knee Complete 4 Views Right  Result Date: 02/10/2021 CLINICAL DATA:  Slipped and fell with knee pain EXAM: RIGHT KNEE - COMPLETE 4+ VIEW COMPARISON:  None. FINDINGS: No effusion. No acute fracture. There is chronic deformity of the proximal fibula related to an old healed fracture. Medial compartment osteophytes are noted. IMPRESSION: No acute finding. Old healed proximal fibular fracture. Medial weight-bearing compartment marginal osteophytes. Electronically Signed   By: Paulina Fusi M.D.   On: 02/10/2021 13:41    ____________________________________________   PROCEDURES Procedures  ____________________________________________  CLINICAL IMPRESSION / ASSESSMENT AND PLAN / ED COURSE  Pertinent labs & imaging results that were available during my care of the patient were reviewed by me and considered in my medical decision making (see chart for details).  Roberto Dudley was evaluated in Emergency Department on 02/10/2021 for the symptoms described in the history of present illness. He was evaluated in the context of the global COVID-19 pandemic, which necessitated consideration that the patient might be at risk for infection with the SARS-CoV-2 virus that causes COVID-19. Institutional protocols and algorithms that pertain to the evaluation of patients at risk for COVID-19 are in a state of rapid change based on information released by regulatory bodies including the CDC and federal and state organizations. These policies and algorithms were followed during the patient's care in the ED.   Patient presents with contusion to the right knee.  Will obtain x-rays to evaluate for fracture.  Will be stable for discharge home, recommend RICE therapy today if no fracture.   ----------------------------------------- 1:56 PM on  02/10/2021 ----------------------------------------- X-ray negative for fracture.     ____________________________________________   FINAL CLINICAL IMPRESSION(S) / ED DIAGNOSES    Final diagnoses:  Contusion of right knee, initial encounter     ED Discharge Orders     None       Portions of this note were generated with dragon dictation software. Dictation errors may occur despite best attempts at proofreading.   Sharman Cheek, MD 02/10/21 1356

## 2021-02-10 NOTE — ED Triage Notes (Signed)
Pt comes with c/o slip and fall on wet porch. Pt denies any LOC. Pt did hit head but denies any pain. Pt states right knee pain and shin. Pt states some foot pain as well.

## 2021-08-08 ENCOUNTER — Encounter: Payer: Self-pay | Admitting: Nurse Practitioner

## 2021-08-08 ENCOUNTER — Telehealth: Payer: Self-pay | Admitting: Nurse Practitioner

## 2021-08-08 DIAGNOSIS — M25512 Pain in left shoulder: Secondary | ICD-10-CM

## 2021-08-08 MED ORDER — IBUPROFEN 800 MG PO TABS: 800.0000 mg | ORAL_TABLET | Freq: Three times a day (TID) | ORAL | 0 refills | Status: DC | PRN

## 2021-08-08 MED ORDER — BACLOFEN 10 MG PO TABS: 10.0000 mg | ORAL_TABLET | Freq: Three times a day (TID) | ORAL | 0 refills | Status: DC

## 2021-08-08 NOTE — Progress Notes (Signed)
Virtual Visit Consent   Roberto Dudley, you are scheduled for a virtual visit with a [facility] provider today. Just as with appointments in the office, your consent must be obtained to participate. Your consent will be active for this visit and any virtual visit you may have with one of our providers in the next 365 days. If you have a MyChart account, a copy of this consent can be sent to you electronically.  As this is a virtual visit, video technology does not allow for your provider to perform a traditional examination. This may limit your provider's ability to fully assess your condition. If your provider identifies any concerns that need to be evaluated in person or the need to arrange testing (such as labs, EKG, etc.), we will make arrangements to do so. Although advances in technology are sophisticated, we cannot ensure that it will always work on either your end or our end. If the connection with a video visit is poor, the visit may have to be switched to a telephone visit. With either a video or telephone visit, we are not always able to ensure that we have a secure connection.  By engaging in this virtual visit, you consent to the provision of healthcare and authorize for your insurance to be billed (if applicable) for the services provided during this visit. Depending on your insurance coverage, you may receive a charge related to this service.  I need to obtain your verbal consent now. Are you willing to proceed with your visit today? Roberto Dudley has provided verbal consent on 08/08/2021 for a virtual visit (video or telephone). Claiborne Rigg, NP  Date: 08/08/2021 5:53 PM  Virtual Visit via Video Note   I, Claiborne Rigg, connected with  Roberto Dudley  ([id number], Feb 07, 1986) on 08/08/21 at  6:00 PM EDT by a video-enabled telemedicine application and verified that I am speaking with the correct person using two identifiers.  Location: Patient: Virtual Visit  Location Patient: Home Provider: Virtual Visit Location Provider: Home Office   I discussed the limitations of evaluation and management by telemedicine and the availability of in person appointments. The patient expressed understanding and agreed to proceed.    History of Present Illness: Roberto Dudley is a 36 y.o. who identifies as a male who was assigned male at birth, and is being seen today for left shoulder and left thumb pain. Mr. Anglemyer states he fell down some stairs yesterday and injured his left shoulder and left.  Currently with decreased range of motion of both but does not endorse any obvious bone or joint deformities. There is bruising noted to left shoulder as well.   HPI: HPI  Problems: There are no problems to display for this patient.   Allergies:  Allergies  Allergen Reactions   Elemental Sulfur    Sulfa Antibiotics    Sulfites    Sulfonylureas    Medications:  Current Outpatient Medications:    baclofen (LIORESAL) 10 MG tablet, Take 1 tablet (10 mg total) by mouth 3 (three) times daily., Disp: 42 each, Rfl: 0   ibuprofen (ADVIL) 800 MG tablet, Take 1 tablet (800 mg total) by mouth every 8 (eight) hours as needed., Disp: 60 tablet, Rfl: 0  Observations/Objective: Patient is well-developed, well-nourished in no acute distress.  Resting comfortably  at home.  Head is normocephalic, atraumatic.  No labored breathing.  Speech is clear and coherent with logical content.  Patient is alert and oriented at baseline.  Assessment and Plan: 1. Acute pain of left shoulder - ibuprofen (ADVIL) 800 MG tablet; Take 1 tablet (800 mg total) by mouth every 8 (eight) hours as needed.  Dispense: 60 tablet; Refill: 0 - baclofen (LIORESAL) 10 MG tablet; Take 1 tablet (10 mg total) by mouth 3 (three) times daily.  Dispense: 42 each; Refill: 0 Work on losing weight to help reduce joint pain. May alternate with heat and ice application for pain relief. May also alternate with  acetaminophen and Ibuprofen as prescribed pain relief. Other alternatives include massage, acupuncture and water aerobics.      Follow Up Instructions: I discussed the assessment and treatment plan with the patient. The patient was provided an opportunity to ask questions and all were answered. The patient agreed with the plan and demonstrated an understanding of the instructions.  A copy of instructions were sent to the patient via MyChart unless otherwise noted below.    The patient was advised to call back or seek an in-person evaluation if the symptoms worsen or if the condition fails to improve as anticipated.  Time:  I spent 12 minutes with the patient via telehealth technology discussing the above problems/concerns.    Claiborne Rigg, NP

## 2021-08-08 NOTE — Patient Instructions (Signed)
  Roberto Frohlich Territo, thank you for joining Claiborne Rigg, NP for today's virtual visit.  While this provider is not your primary care provider (PCP), if your PCP is located in our provider database this encounter information will be shared with them immediately following your visit.  Consent: (Patient) Roberto Dudley provided verbal consent for this virtual visit at the beginning of the encounter.  Current Medications:  Current Outpatient Medications:    baclofen (LIORESAL) 10 MG tablet, Take 1 tablet (10 mg total) by mouth 3 (three) times daily., Disp: 42 each, Rfl: 0   ibuprofen (ADVIL) 800 MG tablet, Take 1 tablet (800 mg total) by mouth every 8 (eight) hours as needed., Disp: 60 tablet, Rfl: 0   Medications ordered in this encounter:  Meds ordered this encounter  Medications   ibuprofen (ADVIL) 800 MG tablet    Sig: Take 1 tablet (800 mg total) by mouth every 8 (eight) hours as needed.    Dispense:  60 tablet    Refill:  0    Order Specific Question:   Supervising Provider    Answer:   MILLER, BRIAN [[id number]]   baclofen (LIORESAL) 10 MG tablet    Sig: Take 1 tablet (10 mg total) by mouth 3 (three) times daily.    Dispense:  42 each    Refill:  0    Order Specific Question:   Supervising Provider    Answer:   Hyacinth Meeker, BRIAN [[id number]]     *If you need refills on other medications prior to your next appointment, please contact your pharmacy*  Follow-Up: Call back or seek an in-person evaluation if the symptoms worsen or if the condition fails to improve as anticipated.  Other Instructions May alternate with heat and ice application for pain relief. May also alternate with acetaminophen and Ibuprofen as prescribed pain relief. Other alternatives include massage, acupuncture and water aerobics.     If you have been instructed to have an in-person evaluation today at a local Urgent Care facility, please use the link below. It will take you to a list of all of our available [facility], including address, phone number and hours of operation. Please do not delay care.  [facility]  If you or a family member do not have a primary care provider, use the link below to schedule a visit and establish care. When you choose a [facility] primary care physician or advanced practice provider, you gain a long-term partner in health. Find a Primary Care Provider  Learn more about [facility]'s in-office and virtual care options: [facility] - Get Care Now

## 2021-08-31 ENCOUNTER — Ambulatory Visit (INDEPENDENT_AMBULATORY_CARE_PROVIDER_SITE_OTHER): Payer: Self-pay | Admitting: Nurse Practitioner

## 2021-08-31 ENCOUNTER — Encounter: Payer: Self-pay | Admitting: Nurse Practitioner

## 2021-08-31 ENCOUNTER — Ambulatory Visit (INDEPENDENT_AMBULATORY_CARE_PROVIDER_SITE_OTHER)
Admission: RE | Admit: 2021-08-31 | Discharge: 2021-08-31 | Disposition: A | Discharge status: Home / Self Care | Payer: Self-pay | Source: Ambulatory Visit | Attending: Nurse Practitioner | Admitting: Nurse Practitioner

## 2021-08-31 VITALS — BP 140/90 | HR 82 | Temp 97.5°F | Resp 12 | Ht 65.0 in | Wt 155.1 lb

## 2021-08-31 DIAGNOSIS — R1030 Lower abdominal pain, unspecified: Secondary | ICD-10-CM

## 2021-08-31 DIAGNOSIS — Z720 Tobacco use: Secondary | ICD-10-CM

## 2021-08-31 DIAGNOSIS — Z0000 Encounter for general adult medical examination without abnormal findings: Secondary | ICD-10-CM

## 2021-08-31 DIAGNOSIS — G43109 Migraine with aura, not intractable, without status migrainosus: Secondary | ICD-10-CM

## 2021-08-31 DIAGNOSIS — R3911 Hesitancy of micturition: Secondary | ICD-10-CM

## 2021-08-31 LAB — POC URINALSYSI DIPSTICK (AUTOMATED)
Bilirubin, UA: NEGATIVE
Blood, UA: NEGATIVE
Glucose, UA: NEGATIVE
Ketones, UA: NEGATIVE
Leukocytes, UA: NEGATIVE
Nitrite, UA: NEGATIVE
Protein, UA: POSITIVE — AB
Spec Grav, UA: 1.015 (ref 1.010–1.025)
Urobilinogen, UA: 0.2 E.U./dL
pH, UA: 7 (ref 5.0–8.0)

## 2021-08-31 LAB — COMPREHENSIVE METABOLIC PANEL
ALT: 26 U/L (ref 0–53)
AST: 19 U/L (ref 0–37)
Albumin: 4.5 g/dL (ref 3.5–5.2)
Alkaline Phosphatase: 65 U/L (ref 39–117)
BUN: 16 mg/dL (ref 6–23)
CO2: 26 mEq/L (ref 19–32)
Calcium: 10 mg/dL (ref 8.4–10.5)
Chloride: 105 mEq/L (ref 96–112)
Creatinine, Ser: 0.83 mg/dL (ref 0.40–1.50)
GFR: 112.32 mL/min (ref >60.00)
Glucose, Bld: 87 mg/dL (ref 70–99)
Potassium: 4.2 mEq/L (ref 3.5–5.1)
Sodium: 142 mEq/L (ref 135–145)
Total Bilirubin: 0.7 mg/dL (ref 0.2–1.2)
Total Protein: 6.9 g/dL (ref 6.0–8.3)

## 2021-08-31 LAB — TSH: TSH: 1.29 u[IU]/mL (ref 0.35–5.50)

## 2021-08-31 LAB — CBC
HCT: 41.5 % (ref 39.0–52.0)
Hemoglobin: 13.9 g/dL (ref 13.0–17.0)
MCHC: 33.5 g/dL (ref 30.0–36.0)
MCV: 93.4 fl (ref 78.0–100.0)
Platelets: 229 10*3/uL (ref 150.0–400.0)
RBC: 4.44 Mil/uL (ref 4.22–5.81)
RDW: 12.6 % (ref 11.5–15.5)
WBC: 7.1 10*3/uL (ref 4.0–10.5)

## 2021-08-31 LAB — LIPASE: Lipase: 22 U/L (ref 11.0–59.0)

## 2021-08-31 LAB — SEDIMENTATION RATE: Sed Rate: 18 mm/hr — ABNORMAL HIGH (ref 0–15)

## 2021-08-31 MED ORDER — NORTRIPTYLINE HCL 10 MG PO CAPS: 10.0000 mg | ORAL_CAPSULE | Freq: Every day | ORAL | 1 refills | Status: DC

## 2021-08-31 NOTE — Assessment & Plan Note (Signed)
Ambiguous intermittent lower abdominal pain.  We will check basic labs today.  Urinalysis was negative except for some protein but no blood or signs of infection.  We will also obtain plain film abdominal 2 view, pending results of labs and imaging

## 2021-10-12 ENCOUNTER — Ambulatory Visit: Payer: Medicaid Other | Admitting: Nurse Practitioner

## 2021-10-13 ENCOUNTER — Ambulatory Visit: Payer: Medicaid Other | Admitting: Nurse Practitioner

## 2021-11-21 ENCOUNTER — Other Ambulatory Visit: Payer: Self-pay | Admitting: Nurse Practitioner

## 2021-11-21 DIAGNOSIS — G43109 Migraine with aura, not intractable, without status migrainosus: Secondary | ICD-10-CM

## 2022-06-03 ENCOUNTER — Emergency Department: Payer: Medicaid Other

## 2022-06-03 ENCOUNTER — Other Ambulatory Visit: Payer: Self-pay

## 2022-06-03 ENCOUNTER — Emergency Department
Admission: EM | Admit: 2022-06-03 | Discharge: 2022-06-03 | Disposition: A | Discharge status: Home / Self Care | Payer: Medicaid Other | Attending: Emergency Medicine | Admitting: Emergency Medicine

## 2022-06-03 DIAGNOSIS — D72829 Elevated white blood cell count, unspecified: Secondary | ICD-10-CM | POA: Diagnosis not present

## 2022-06-03 DIAGNOSIS — I700 Atherosclerosis of aorta: Secondary | ICD-10-CM | POA: Diagnosis not present

## 2022-06-03 DIAGNOSIS — J45909 Unspecified asthma, uncomplicated: Secondary | ICD-10-CM | POA: Diagnosis not present

## 2022-06-03 DIAGNOSIS — J329 Chronic sinusitis, unspecified: Secondary | ICD-10-CM | POA: Insufficient documentation

## 2022-06-03 DIAGNOSIS — R202 Paresthesia of skin: Secondary | ICD-10-CM | POA: Insufficient documentation

## 2022-06-03 DIAGNOSIS — Z87891 Personal history of nicotine dependence: Secondary | ICD-10-CM | POA: Diagnosis not present

## 2022-06-03 DIAGNOSIS — R0789 Other chest pain: Secondary | ICD-10-CM | POA: Diagnosis not present

## 2022-06-03 DIAGNOSIS — T1490XA Injury, unspecified, initial encounter: Secondary | ICD-10-CM | POA: Diagnosis not present

## 2022-06-03 DIAGNOSIS — R200 Anesthesia of skin: Secondary | ICD-10-CM | POA: Diagnosis not present

## 2022-06-03 DIAGNOSIS — R079 Chest pain, unspecified: Secondary | ICD-10-CM | POA: Diagnosis not present

## 2022-06-03 LAB — CBC
HCT: 49.1 % (ref 39.0–52.0)
Hemoglobin: 16.5 g/dL (ref 13.0–17.0)
MCH: 31.2 pg (ref 26.0–34.0)
MCHC: 33.6 g/dL (ref 30.0–36.0)
MCV: 92.8 fL (ref 80.0–100.0)
Platelets: 297 10*3/uL (ref 150–400)
RBC: 5.29 MIL/uL (ref 4.22–5.81)
RDW: 12.5 % (ref 11.5–15.5)
WBC: 12.3 10*3/uL — ABNORMAL HIGH (ref 4.0–10.5)
nRBC: 0 % (ref 0.0–0.2)

## 2022-06-03 LAB — BASIC METABOLIC PANEL
Anion gap: 13 (ref 5–15)
BUN: 18 mg/dL (ref 6–20)
CO2: 21 mmol/L — ABNORMAL LOW (ref 22–32)
Calcium: 9.7 mg/dL (ref 8.9–10.3)
Chloride: 102 mmol/L (ref 98–111)
Creatinine, Ser: 1 mg/dL (ref 0.61–1.24)
GFR, Estimated: >60 mL/min (ref >60)
Glucose, Bld: 94 mg/dL (ref 70–99)
Potassium: 4.1 mmol/L (ref 3.5–5.1)
Sodium: 136 mmol/L (ref 135–145)

## 2022-06-03 LAB — HEPATIC FUNCTION PANEL
ALT: 41 U/L (ref 0–44)
AST: 30 U/L (ref 15–41)
Albumin: 4.8 g/dL (ref 3.5–5.0)
Alkaline Phosphatase: 73 U/L (ref 38–126)
Bilirubin, Direct: 0.3 mg/dL — ABNORMAL HIGH (ref 0.0–0.2)
Indirect Bilirubin: 1.4 mg/dL — ABNORMAL HIGH (ref 0.3–0.9)
Total Bilirubin: 1.7 mg/dL — ABNORMAL HIGH (ref 0.3–1.2)
Total Protein: 8.5 g/dL — ABNORMAL HIGH (ref 6.5–8.1)

## 2022-06-03 LAB — TROPONIN I (HIGH SENSITIVITY)
Troponin I (High Sensitivity): 4 ng/L (ref <18)
Troponin I (High Sensitivity): 5 ng/L (ref <18)

## 2022-06-03 MED ORDER — ONDANSETRON HCL 4 MG/2ML IJ SOLN
4.0000 mg | Freq: Once | INTRAMUSCULAR | Status: AC
  Administered 2022-06-03: 4 mg via INTRAVENOUS
  Filled 2022-06-03: qty 2

## 2022-06-03 MED ORDER — MORPHINE SULFATE (PF) 4 MG/ML IV SOLN
4.0000 mg | Freq: Once | INTRAVENOUS | Status: AC
  Administered 2022-06-03: 4 mg via INTRAVENOUS
  Filled 2022-06-03: qty 1

## 2022-06-03 MED ORDER — AMOXICILLIN-POT CLAVULANATE 875-125 MG PO TABS: 1.0000 | ORAL_TABLET | Freq: Two times a day (BID) | ORAL | 0 refills | Status: AC

## 2022-06-03 MED ORDER — IOHEXOL 350 MG/ML SOLN
100.0000 mL | Freq: Once | INTRAVENOUS | Status: AC | PRN
  Administered 2022-06-03: 100 mL via INTRAVENOUS

## 2022-06-03 NOTE — Discharge Instructions (Addendum)
We are treating you with antibiotics for possible sinusitis seen on CT scan.  Your CT scans were otherwise reassuring for acute pathology although there are some incidental findings as we discussed you can see below.  We have referred you to cardiology.  Please return to the ER if you develop return of symptoms or any other concerns.  Follow-up for recheck of your blood pressure outpatient.  IMPRESSION: 1. Normal contour and caliber of the thoracic and abdominal aorta. No evidence of aneurysm, dissection, or other acute aortic pathology. Minimal abdominal aortic atherosclerosis. 2. Minimal paraseptal emphysema and diffuse bilateral bronchial wall thickening. 3. Long segment fatty mural stratification of the distal ileum, cecum, and ascending colon, consistent with chronic sequelae of prior inflammation. This appearance particularly suggests history of inflammatory bowel disease such as Crohn's disease. Correlate with referable clinical history. 4. Cholelithiasis without evidence of acute cholecystitis.    IMPRESSION: 1. No acute intracranial abnormality. 2. No acute fracture or traumatic malalignment in the cervical spine. 3. Frothy secretions left sphenoid sinus, which can be seen in the setting of acute sinusitis.

## 2022-06-03 NOTE — ED Notes (Signed)
See triage note. Pt reports left sided chest pain and left arm numbness that started within the hour. Able to move all extremities. Equal grip and strength. Clear speech. Alert and oriented. Face symmetrical. Also reports some tingling in left leg, states this can be normal d/t fracturing leg in past.  Nad noted.

## 2022-06-03 NOTE — ED Provider Notes (Signed)
[facility] Provider Note    Event Date/Time   First MD Initiated Contact with Patient 06/03/22 [phone]     (approximate)   History   Chest Pain   HPI  Roberto Dudley is a 37 y.o. male with asthma who comes in with concern for chest pain.  Positive family history of a heart attack at a young age and his father I reviewed the notes were patient was seen back in 2022 and referred to cardiology but he never got follow-up.  During this visit he also had some tingling in the left arm as well with the chest pain.   Patient reports that he intermittently gets tingling to the left arm but this is the first time that it has been more prolonged.  He reports that his symptoms started around 8:15 AM.  He reports waking up normal and feeling his normal self when he then developed some tingling in the left arm.  He reports a little bit of tingling in the left thigh but he states that this is baseline he intermittently has due to having a bunch of reconstruction surgeries on his left arm previously.  He does report smoking history.  He reports that with the tingling he developed a 5 out of 10 chest pain as well.  He denies any headaches, falls, abdominal pain.  Does report prior neck injuries.   Physical Exam   Triage Vital Signs: ED Triage Vitals [06/03/22 0916]  Enc Vitals Group     BP (!) 165/114     Pulse Rate (!) 103     Resp 18     Temp 98.6 F (37 C)     Temp Source Oral     SpO2 99 %     Weight      Height      Head Circumference      Peak Flow      Pain Score 6     Pain Loc      Pain Edu?      Excl. in [place]?     Most recent vital signs: Vitals:   06/03/22 0916  BP: (!) 165/114  Pulse: (!) 103  Resp: 18  Temp: 98.6 F (37 C)  SpO2: 99%     General: Awake, no distress.  CV:  Good peripheral perfusion.  Resp:  Normal effort.  Abd:  No distention.  Other:  Good distal pulse in the arm.   he reports some subjective tingling in the left arm and  the left upper thigh.  No weakness normal.  Cranial nerves II through XII otherwise intact.  NIH stroke scale of 1   ED Results / Procedures / Treatments   Labs (all labs ordered are listed, but only abnormal results are displayed) Labs Reviewed  BASIC METABOLIC PANEL - Abnormal; Notable for the following components:      Result Value   CO2 21 (*)    All other components within normal limits  CBC - Abnormal; Notable for the following components:   WBC 12.3 (*)    All other components within normal limits  HEPATIC FUNCTION PANEL  TROPONIN I (HIGH SENSITIVITY)     EKG  My interpretation of EKG:  Sinus tachycardia rate of 106 without any ST elevations, T wave inversion in V3, right bundle branch block.  Reviewed patient's prior EKG and he has had similar right bundle branch block before  RADIOLOGY I have reviewed the xray personally and interpreted and no  evidence of any pneumonia PROCEDURES:  Critical Care performed: No  .1-3 Lead EKG Interpretation  Performed by: Vanessa [place], MD Authorized by: Vanessa [place], MD     Interpretation: abnormal     ECG rate:  103   ECG rate assessment: tachycardic     Rhythm: sinus tachycardia     Ectopy: none     Conduction: normal      MEDICATIONS ORDERED IN ED: Medications  morphine (PF) 4 MG/ML injection 4 mg (4 mg Intravenous Given 06/03/22 0941)  ondansetron (ZOFRAN) injection 4 mg (4 mg Intravenous Given 06/03/22 0940)  iohexol (OMNIPAQUE) 350 MG/ML injection 100 mL (100 mLs Intravenous Contrast Given 06/03/22 1031)     IMPRESSION / MDM / ASSESSMENT AND PLAN / ED COURSE  I reviewed the triage vital signs and the nursing notes.   Patient's presentation is most consistent with acute presentation with potential threat to life or bodily function.   Patient comes in with left arm tingling with chest pain.  Notably hypertensive.  Concern for possible dissection.  Considered the possibility of stroke NIH stroke scale is 1 based on  some subjective tingling in the left arm.  He has no weakness in the arm no other cranial nerve deficits are noted.  I did not call a stroke code given unlikely to be receiving tPA and my concern that patient could be having a dissection therefore we will proceed with cardiac workup, dissection workup and reevaluate patient.  Patient given some IV morphine and Zofran to help with symptoms.  After review of records he has been seen before for some tingling in the arm does report some prior injuries to neck so I wonder if this could be more of like a pinched nerve.  BMP is reassuring.  CBC slightly elevated white count.  Initial troponin is negative.  11:01 AM reevaluated patient has had resolution of tingling  12:15 PM reevaluated patient updated on results/incidental findings/provided copy of remports.   1. Normal contour and caliber of the thoracic and abdominal aorta. No evidence of aneurysm, dissection, or other acute aortic pathology. Minimal abdominal aortic atherosclerosis. 2. Minimal paraseptal emphysema and diffuse bilateral bronchial wall thickening. 3. Long segment fatty mural stratification of the distal ileum, cecum, and ascending colon, consistent with chronic sequelae of prior inflammation. This appearance particularly suggests history of inflammatory bowel disease such as Crohn's disease. Correlate with referable clinical history. 4. Cholelithiasis without evidence of acute cholecystitis.  IMPRESSION: 1. No acute intracranial abnormality. 2. No acute fracture or traumatic malalignment in the cervical spine. 3. Frothy secretions left sphenoid sinus, which can be seen in the setting of acute sinusitis.  Patient continues to have resolution of chest pain, tingling.  We discussed steroids if this could be like a pinched nerve that intermittently is happening but he declines.  He has resolution of symptoms we discussed MRI to rule out any type of stroke but given low suspicion  given resolution of symptoms and the pain associated with it seems to be unlikely.  He is agreeable to holding off on MRI given low suspicion for stroke at this time.  Will get a repeat troponin to ensure no ACS and have him follow-up outpatient with cardiology.  He does report some symptoms of sinusitis as well so we will give him on some antibiotics for this.  He has gallstones noted on his CT scan but his pain was on the left side of his chest does not seem to be consistent with  cholecystitis.  Had a discussion with patient that we do not predict future heart attacks and if he develops a change in symptoms or worsening symptoms or return of symptoms that he would need to return to the ER for repeat evaluation given he does have risk factors for ACS.  He also will need to follow-up for recheck blood pressure and he expressed understanding felt comfortable with plan considered admission patient feels comfortable with discharge home given resolution of symptoms   2:17 PM repeat troponin remains negative.  Patient remains without symptoms on reevaluation and feels comfortable with discharge home with follow-up outpatient with cardiology and for repeat blood pressure  The patient is on the cardiac monitor to evaluate for evidence of arrhythmia and/or significant heart rate changes.      FINAL CLINICAL IMPRESSION(S) / ED DIAGNOSES   Final diagnoses:  Chest pain, unspecified type  Sinusitis, unspecified chronicity, unspecified location     Rx / DC Orders   ED Discharge Orders          Ordered    Ambulatory referral to Cardiology        06/03/22 1217    amoxicillin-clavulanate (AUGMENTIN) 875-125 MG tablet  2 times daily        06/03/22 1217             Note:  This document was prepared using Dragon voice recognition software and may include unintentional dictation errors.   Vanessa [place], MD 06/03/22 [phone]

## 2022-06-03 NOTE — ED Triage Notes (Signed)
Pt to ED via POV from home. Pt reports CP and numbness in left arm. Pt reports pain started 77mins PTA.

## 2022-07-12 IMAGING — CR DG CHEST 2V
1 series · 2 of 2 positions shown · non-contrast
Comparison: 09/13/2020

CLINICAL DATA: Chest pain, shortness of breath, weakness and
dizziness.

EXAM:
CHEST - 2 VIEW

[Series 1: w chest pa · 0.14mm/px · 2 of 2 slices shown]
[im 1/2]
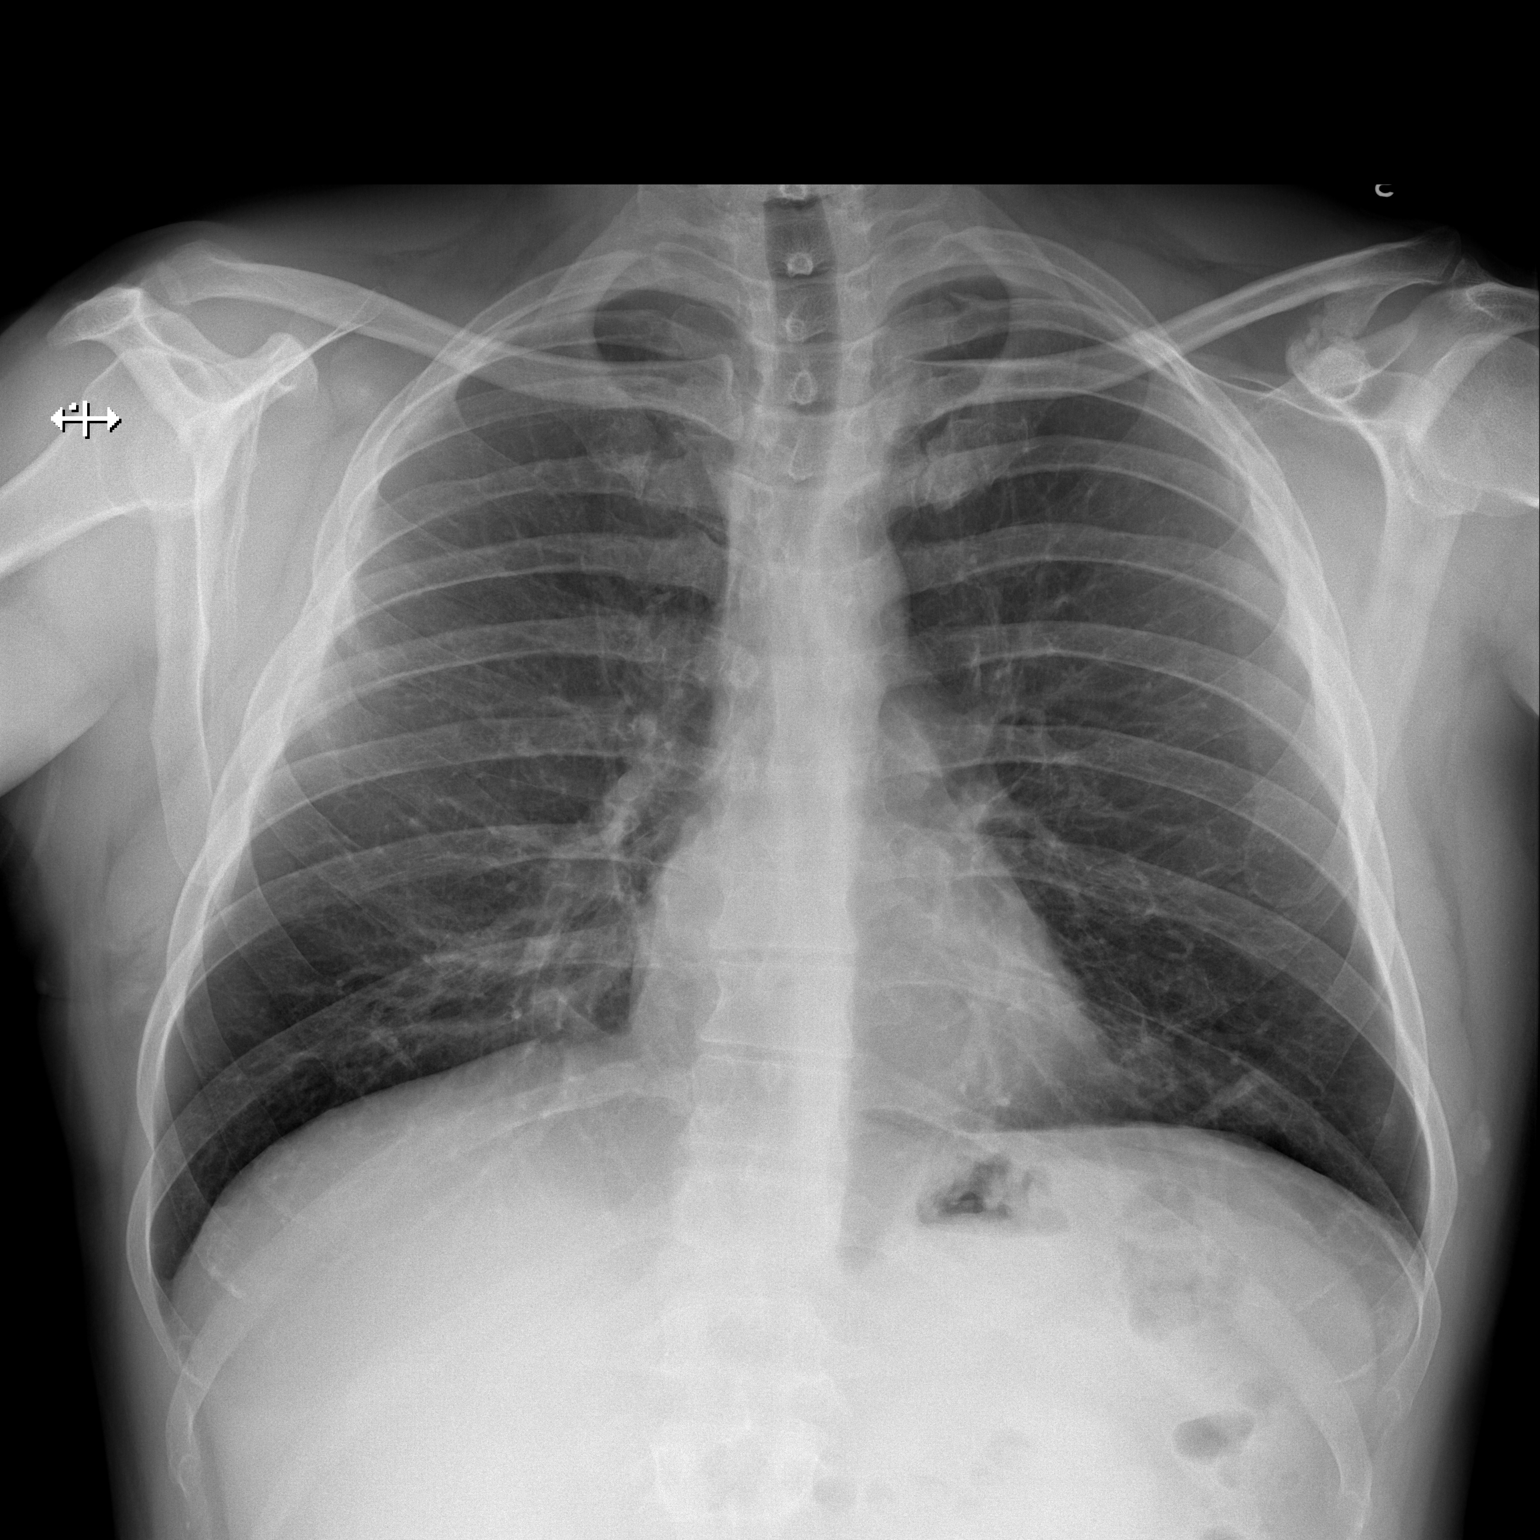
[im 2/2]
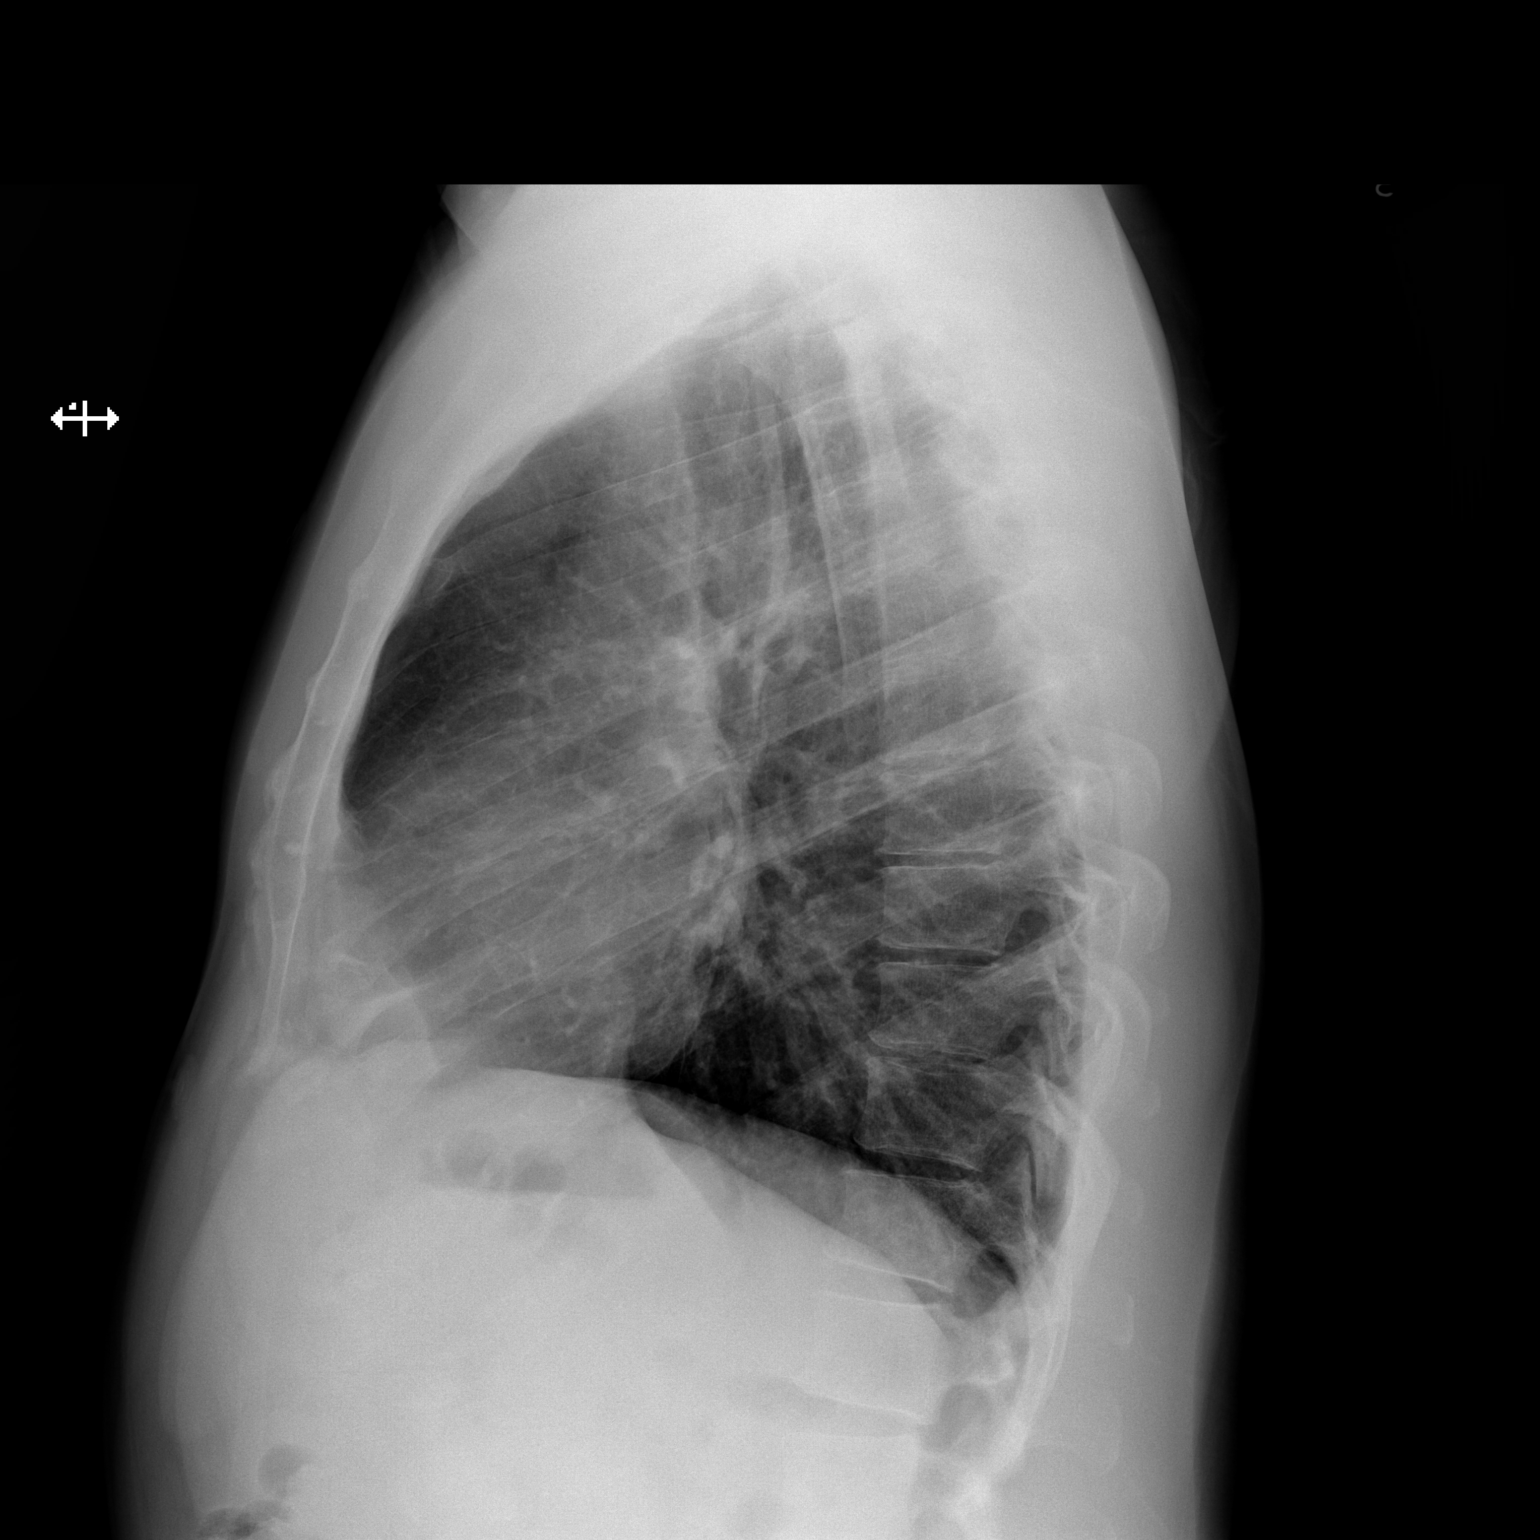

[2 of 2 positions shown; findings below may reference images not displayed]

FINDINGS: Heart size and mediastinal contours are normal. No pleural effusion
or edema identified. No airspace opacities identified. Visualized
osseous structures are unremarkable.
IMPRESSION: No active cardiopulmonary disease.

## 2022-10-14 DIAGNOSIS — M5441 Lumbago with sciatica, right side: Secondary | ICD-10-CM | POA: Diagnosis not present

## 2022-10-14 DIAGNOSIS — G8929 Other chronic pain: Secondary | ICD-10-CM | POA: Diagnosis not present

## 2022-10-14 DIAGNOSIS — M5442 Lumbago with sciatica, left side: Secondary | ICD-10-CM | POA: Diagnosis not present

## 2022-11-16 DIAGNOSIS — K047 Periapical abscess without sinus: Secondary | ICD-10-CM | POA: Diagnosis not present

## 2023-01-02 ENCOUNTER — Other Ambulatory Visit: Payer: Self-pay

## 2023-01-02 ENCOUNTER — Inpatient Hospital Stay: Payer: Medicaid Other | Admitting: Certified Registered"

## 2023-01-02 ENCOUNTER — Encounter
Admission: EM | Disposition: A | Discharge status: Home / Self Care | Payer: Self-pay | Source: Home / Self Care | Attending: Internal Medicine

## 2023-01-02 ENCOUNTER — Observation Stay
Admission: EM | Admit: 2023-01-02 | Discharge: 2023-01-02 | Disposition: A | Discharge status: Home / Self Care | Payer: Medicaid Other | Attending: Internal Medicine | Admitting: Internal Medicine

## 2023-01-02 DIAGNOSIS — K920 Hematemesis: Secondary | ICD-10-CM | POA: Diagnosis not present

## 2023-01-02 DIAGNOSIS — F1010 Alcohol abuse, uncomplicated: Secondary | ICD-10-CM | POA: Diagnosis not present

## 2023-01-02 DIAGNOSIS — F1090 Alcohol use, unspecified, uncomplicated: Secondary | ICD-10-CM

## 2023-01-02 DIAGNOSIS — K2900 Acute gastritis without bleeding: Secondary | ICD-10-CM

## 2023-01-02 DIAGNOSIS — Y900 Blood alcohol level of less than 20 mg/100 ml: Secondary | ICD-10-CM | POA: Insufficient documentation

## 2023-01-02 DIAGNOSIS — K2971 Gastritis, unspecified, with bleeding: Principal | ICD-10-CM | POA: Insufficient documentation

## 2023-01-02 DIAGNOSIS — J45909 Unspecified asthma, uncomplicated: Secondary | ICD-10-CM | POA: Diagnosis not present

## 2023-01-02 DIAGNOSIS — K2970 Gastritis, unspecified, without bleeding: Secondary | ICD-10-CM | POA: Insufficient documentation

## 2023-01-02 DIAGNOSIS — R000 Tachycardia, unspecified: Secondary | ICD-10-CM | POA: Diagnosis not present

## 2023-01-02 DIAGNOSIS — F17210 Nicotine dependence, cigarettes, uncomplicated: Secondary | ICD-10-CM | POA: Insufficient documentation

## 2023-01-02 DIAGNOSIS — K226 Gastro-esophageal laceration-hemorrhage syndrome: Secondary | ICD-10-CM

## 2023-01-02 HISTORY — PX: ESOPHAGOGASTRODUODENOSCOPY (EGD) WITH PROPOFOL: SHX5813

## 2023-01-02 LAB — COMPREHENSIVE METABOLIC PANEL
ALT: 38 U/L (ref 0–44)
AST: 38 U/L (ref 15–41)
Albumin: 4.5 g/dL (ref 3.5–5.0)
Alkaline Phosphatase: 79 U/L (ref 38–126)
Anion gap: 12 (ref 5–15)
BUN: 20 mg/dL (ref 6–20)
CO2: 23 mmol/L (ref 22–32)
Calcium: 9.2 mg/dL (ref 8.9–10.3)
Chloride: 105 mmol/L (ref 98–111)
Creatinine, Ser: 0.83 mg/dL (ref 0.61–1.24)
GFR, Estimated: >60 mL/min (ref >60)
Glucose, Bld: 117 mg/dL — ABNORMAL HIGH (ref 70–99)
Potassium: 3.8 mmol/L (ref 3.5–5.1)
Sodium: 140 mmol/L (ref 135–145)
Total Bilirubin: 0.6 mg/dL (ref 0.3–1.2)
Total Protein: 7.8 g/dL (ref 6.5–8.1)

## 2023-01-02 LAB — CBC
HCT: 43.1 % (ref 39.0–52.0)
Hemoglobin: 14.7 g/dL (ref 13.0–17.0)
MCH: 31.7 pg (ref 26.0–34.0)
MCHC: 34.1 g/dL (ref 30.0–36.0)
MCV: 93.1 fL (ref 80.0–100.0)
Platelets: 294 10*3/uL (ref 150–400)
RBC: 4.63 MIL/uL (ref 4.22–5.81)
RDW: 12.4 % (ref 11.5–15.5)
WBC: 12.1 10*3/uL — ABNORMAL HIGH (ref 4.0–10.5)
nRBC: 0 % (ref 0.0–0.2)

## 2023-01-02 LAB — ETHANOL: Alcohol, Ethyl (B): <10 mg/dL (ref <10)

## 2023-01-02 LAB — PROTIME-INR
INR: 0.9 (ref 0.8–1.2)
Prothrombin Time: 12.2 s (ref 11.4–15.2)

## 2023-01-02 LAB — LIPASE, BLOOD: Lipase: 40 U/L (ref 11–51)

## 2023-01-02 SURGERY — ESOPHAGOGASTRODUODENOSCOPY (EGD) WITH PROPOFOL
Anesthesia: General

## 2023-01-02 MED ORDER — PROPOFOL 500 MG/50ML IV EMUL
INTRAVENOUS | Status: DC | PRN
  Administered 2023-01-02: 165 ug/kg/min via INTRAVENOUS

## 2023-01-02 MED ORDER — MIDAZOLAM HCL 2 MG/2ML IJ SOLN
INTRAMUSCULAR | Status: DC | PRN
  Administered 2023-01-02: 2 mg via INTRAVENOUS

## 2023-01-02 MED ORDER — THIAMINE HCL 100 MG/ML IJ SOLN
100.0000 mg | Freq: Every day | INTRAMUSCULAR | Status: DC
  Filled 2023-01-02: qty 2

## 2023-01-02 MED ORDER — MIDAZOLAM HCL 2 MG/2ML IJ SOLN
INTRAMUSCULAR | Status: AC
  Filled 2023-01-02: qty 2

## 2023-01-02 MED ORDER — ONDANSETRON HCL 4 MG PO TABS: 4.0000 mg | ORAL_TABLET | Freq: Four times a day (QID) | ORAL | Status: DC | PRN

## 2023-01-02 MED ORDER — PANTOPRAZOLE SODIUM 40 MG PO TBEC: 40.0000 mg | DELAYED_RELEASE_TABLET | Freq: Every day | ORAL | 0 refills | Status: DC

## 2023-01-02 MED ORDER — SODIUM CHLORIDE 0.9 % IV SOLN: INTRAVENOUS | Status: DC

## 2023-01-02 MED ORDER — MORPHINE SULFATE (PF) 2 MG/ML IV SOLN: 2.0000 mg | INTRAVENOUS | Status: DC | PRN

## 2023-01-02 MED ORDER — ACETAMINOPHEN 650 MG RE SUPP: 650.0000 mg | Freq: Four times a day (QID) | RECTAL | Status: DC | PRN

## 2023-01-02 MED ORDER — THIAMINE HCL 100 MG/ML IJ SOLN
100.0000 mg | Freq: Every day | INTRAMUSCULAR | Status: DC
  Administered 2023-01-02: 100 mg via INTRAVENOUS

## 2023-01-02 MED ORDER — ACETAMINOPHEN 325 MG PO TABS: 650.0000 mg | ORAL_TABLET | Freq: Four times a day (QID) | ORAL | Status: DC | PRN

## 2023-01-02 MED ORDER — PROPOFOL 10 MG/ML IV BOLUS
INTRAVENOUS | Status: DC | PRN
  Administered 2023-01-02: 70 mg via INTRAVENOUS

## 2023-01-02 MED ORDER — SODIUM CHLORIDE 0.9% FLUSH
3.0000 mL | Freq: Two times a day (BID) | INTRAVENOUS | Status: DC
Start: 2023-01-02 — End: 2023-01-02

## 2023-01-02 MED ORDER — FOLIC ACID 5 MG/ML IJ SOLN
1.0000 mg | Freq: Every day | INTRAMUSCULAR | Status: DC
  Filled 2023-01-02: qty 0.2

## 2023-01-02 MED ORDER — OCTREOTIDE LOAD VIA INFUSION
50.0000 ug | Freq: Once | INTRAVENOUS | Status: AC
  Administered 2023-01-02: 50 ug via INTRAVENOUS
  Filled 2023-01-02: qty 25

## 2023-01-02 MED ORDER — SODIUM CHLORIDE 0.9 % IV SOLN
1.0000 g | INTRAVENOUS | Status: AC
  Administered 2023-01-02: 1 g via INTRAVENOUS
  Filled 2023-01-02: qty 10

## 2023-01-02 MED ORDER — LORAZEPAM 2 MG/ML IJ SOLN: 0.0000 mg | Freq: Four times a day (QID) | INTRAMUSCULAR | Status: DC

## 2023-01-02 MED ORDER — LIDOCAINE HCL (CARDIAC) PF 100 MG/5ML IV SOSY
PREFILLED_SYRINGE | INTRAVENOUS | Status: DC | PRN
  Administered 2023-01-02: 100 mg via INTRAVENOUS

## 2023-01-02 MED ORDER — PANTOPRAZOLE SODIUM 40 MG IV SOLR
40.0000 mg | INTRAVENOUS | Status: AC
  Administered 2023-01-02 (×2): 40 mg via INTRAVENOUS
  Filled 2023-01-02 (×2): qty 10

## 2023-01-02 MED ORDER — SODIUM CHLORIDE 0.9 % IV SOLN
50.0000 ug/h | INTRAVENOUS | Status: DC
  Administered 2023-01-02: 50 ug/h via INTRAVENOUS
  Filled 2023-01-02 (×2): qty 1

## 2023-01-02 MED ORDER — ONDANSETRON HCL 4 MG PO TABS: 4.0000 mg | ORAL_TABLET | Freq: Three times a day (TID) | ORAL | 0 refills | Status: DC | PRN

## 2023-01-02 MED ORDER — PANTOPRAZOLE SODIUM 40 MG IV SOLR: 40.0000 mg | Freq: Two times a day (BID) | INTRAVENOUS | Status: DC

## 2023-01-02 MED ORDER — ONDANSETRON HCL 4 MG/2ML IJ SOLN: 4.0000 mg | Freq: Four times a day (QID) | INTRAMUSCULAR | Status: DC | PRN

## 2023-01-02 NOTE — Assessment & Plan Note (Addendum)
Secondary to numerous episodes of vomiting.
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
## 2023-01-02 NOTE — Assessment & Plan Note (Signed)
Advised to hold off on alcohol and anti-inflammatories including Advil Motrin Aleve Goody powder or BC powder and any other anti-inflammatory.  Okay to take Tylenol.  GI recommended PPI daily for couple weeks.

## 2023-01-02 NOTE — ED Triage Notes (Addendum)
Pt reports waking this morning and vomiting. Reports some bright red blood in the vomit but no passage of clots. 1 episode of emesis pta. Denies n/v currently. Pt denies consuming any food or drink with red dye. Pt reports he is a daily ETOH consumer and had 15 beers yesterday. Denies known hx of liver disease or pancreatitis. Pt denies abd pain or diarrhea. Pt ambulatory to triage. Alert and oriented following commands. Breathing unlabored speaking in full sentences. Symmetric chest rise and fall.

## 2023-01-02 NOTE — Hospital Course (Signed)
37 year old man past medical history of alcohol abuse coming in with repeated episodes of nausea vomiting and had hematemesis.  Bowel movements were not black.  Seen by gastroenterology Dr. Mia Creek who did an endoscopy which showed a Mallory-Weiss tear which was healing.  Some gastritis seen.  As per gastroenterology okay to go home lower likelihood of rebleeding.

## 2023-01-02 NOTE — Assessment & Plan Note (Addendum)
No signs of withdrawal.  Advised to discontinue drinking.  Patient does not drink every day.

## 2023-01-02 NOTE — ED Provider Notes (Signed)
[facility] Provider Note    Event Date/Time   First MD Initiated Contact with Patient 01/02/23 0423     (approximate)   History   Emesis   HPI Roberto Dudley is a 37 y.o. male who reports a history of alcohol abuse but no other known medical issues.  He has never before seen a GI doctor.  He presents tonight after acute onset hematemesis.  He reports that he was asleep and when he woke up his stomach was upset and felt very full.  He tried to drink some tea and some milk to settle his stomach, but then suddenly had to vomit.  He initially had the light off so he did not see what was coming out but he said it felt like a large amount of liquid.  He flushed the toilet and then turned on the light and saw that there was bright red blood on and around the toilet.  The patient reports this is never happened before and he has never been evaluated to know if he has esophageal varices or ulcers.  He is in no pain at this time.  He has had no recent trauma.  He states that he drank about 15 beers yesterday which is normal for him.  No lightheadedness nor dizziness.     Physical Exam   Triage Vital Signs: ED Triage Vitals  Encounter Vitals Group     BP 01/02/23 0420 (!) 150/118     Systolic BP Percentile --      Diastolic BP Percentile --      Pulse Rate 01/02/23 0420 (!) 102     Resp 01/02/23 0420 18     Temp 01/02/23 0422 98.4 F (36.9 C)     Temp Source 01/02/23 0420 Oral     SpO2 01/02/23 0420 98 %     Weight 01/02/23 0421 77.1 kg (170 lb)     Height 01/02/23 0421 1.651 m (5\' 5" )     Head Circumference --      Peak Flow --      Pain Score 01/02/23 0420 0     Pain Loc --      Pain Education --      Exclude from Growth Chart --     Most recent vital signs: Vitals:   01/02/23 0420 01/02/23 0422  BP: (!) 150/118   Pulse: (!) 102   Resp: 18   Temp:  98.4 F (36.9 C)  SpO2: 98%     General: Awake, calm, cooperative, no obvious  distress. CV:  Good peripheral perfusion.  Mild tachycardia, regular rhythm. Resp:  Normal effort. Speaking easily and comfortably, no accessory muscle usage nor intercostal retractions.   Abd:  No distention.  No tenderness to palpation of the abdomen at this time.   ED Results / Procedures / Treatments   Labs (all labs ordered are listed, but only abnormal results are displayed) Labs Reviewed  COMPREHENSIVE METABOLIC PANEL - Abnormal; Notable for the following components:      Result Value   Glucose, Bld 117 (*)    All other components within normal limits  CBC - Abnormal; Notable for the following components:   WBC 12.1 (*)    All other components within normal limits  LIPASE, BLOOD  PROTIME-INR  ETHANOL  URINALYSIS, ROUTINE W REFLEX MICROSCOPIC  TYPE AND SCREEN  TYPE AND SCREEN      PROCEDURES:  Critical Care performed: Yes, see critical care procedure note(s)  .  1-3 Lead EKG Interpretation  Performed by: Loleta Rose, MD Authorized by: Loleta Rose, MD     Interpretation: abnormal     ECG rate:  105   ECG rate assessment: tachycardic     Rhythm: sinus tachycardia     Ectopy: none     Conduction: normal   .Critical Care  Performed by: Loleta Rose, MD Authorized by: Loleta Rose, MD   Critical care provider statement:    Critical care time (minutes):  30   Critical care time was exclusive of:  Separately billable procedures and treating other patients   Critical care was necessary to treat or prevent imminent or life-threatening deterioration of the following conditions: acute upper GI bleed requiring infusions.   Critical care was time spent personally by me on the following activities:  Development of treatment plan with patient or surrogate, evaluation of patient's response to treatment, examination of patient, obtaining history from patient or surrogate, ordering and performing treatments and interventions, ordering and review of laboratory studies,  ordering and review of radiographic studies, pulse oximetry, re-evaluation of patient's condition and review of old charts     IMPRESSION / MDM / ASSESSMENT AND PLAN / ED COURSE  I reviewed the triage vital signs and the nursing notes.                              Differential diagnosis includes, but is not limited to, variceal bleed, gastric/peptic ulcer, Mallory-Weiss tear.  Patient's presentation is most consistent with acute presentation with potential threat to life or bodily function.  Labs/studies ordered: ethanol, INR, CBC, lipase, type and screeen, urinalysis, CMP  Interventions/Medications given:  Medications  pantoprazole (PROTONIX) injection 40 mg (40 mg Intravenous Given 01/02/23 0504)    Followed by  pantoprazole (PROTONIX) injection 40 mg (has no administration in time range)  octreotide (SANDOSTATIN) 2 mcg/mL load via infusion 50 mcg (50 mcg Intravenous Bolus from Bag 01/02/23 0523)    And  octreotide (SANDOSTATIN) 500 mcg in sodium chloride 0.9 % 250 mL (2 mcg/mL) infusion (50 mcg/hr Intravenous New Bag/Given 01/02/23 0522)  cefTRIAXone (ROCEPHIN) 1 g in sodium chloride 0.9 % 100 mL IVPB (1 g Intravenous New Bag/Given 01/02/23 0523)  LORazepam (ATIVAN) injection 0-4 mg (has no administration in time range)  thiamine (VITAMIN B1) injection 100 mg (has no administration in time range)    (Note:  hospital course my include additional interventions and/or labs/studies not listed above.)   Patient is mildly tachycardic which could be alcohol withdrawal, volume depletion, or other.  Labs pending at this time.  Given his history of heavy alcohol abuse I have put him on CIWA protocol and will treat aggressively for possible variceal bleed and/or gastric bleed with octreotide bolus and infusion, pantoprazole bolus and infusion, and ceftriaxone 1 g IV for antibiotic prophylaxis, all as listed above.  Ativan available as per CIWA protocol and I also ordered thiamine 100 mg IV.   Patient appears well and stable at this time and I spoke with him and his wife about the plan and they agree with plan for hospitalization.  I will follow-up and reassess after lab work is back.  Given the nature of the upper GI bleed and the lack of abdominal tenderness, I have a low concern for perforation I do not feel at this moment there is a role for emergent CTA, but will consider as the clinical situation warrants.  The patient is on the  cardiac monitor to evaluate for evidence of arrhythmia and/or significant heart rate changes.   Clinical Course as of 01/02/23 0550  Mon Jan 02, 2023  1610 Consulted with Dr. Para March in person.  She will see the patient in the emergency department and admit for further management. [CF]    Clinical Course User Index [CF] Loleta Rose, MD     FINAL CLINICAL IMPRESSION(S) / ED DIAGNOSES   Final diagnoses:  Hematemesis without nausea  Alcohol abuse     Rx / DC Orders   ED Discharge Orders     None        Note:  This document was prepared using Dragon voice recognition software and may include unintentional dictation errors.   Loleta Rose, MD 01/02/23 [phone]

## 2023-01-02 NOTE — ED Notes (Signed)
Patient stated his last alcoholic beverage was at 9 pm last night

## 2023-01-02 NOTE — Assessment & Plan Note (Signed)
Discontinue alcohol and NSAIDs.  PPI.

## 2023-01-02 NOTE — Transfer of Care (Signed)
Immediate Anesthesia Transfer of Care Note  Patient: Ramonte Fecteau Grygiel  Procedure(s) Performed: ESOPHAGOGASTRODUODENOSCOPY (EGD) WITH PROPOFOL  Patient Location: Endoscopy Unit  Anesthesia Type:General  Level of Consciousness: drowsy and patient cooperative  Airway & Oxygen Therapy: Patient Spontanous Breathing and Patient connected to face mask oxygen  Post-op Assessment: Report given to RN and Post -op Vital signs reviewed and stable  Post vital signs: Reviewed and stable  Last Vitals:  Vitals Value Taken Time  BP 131/90 01/02/23 1149  Temp 36.2 C 01/02/23 1148  Pulse 76 01/02/23 1152  Resp 14 01/02/23 1148  SpO2 98 % 01/02/23 1152  Vitals shown include unfiled device data.  Last Pain:  Vitals:   01/02/23 1148  TempSrc: Temporal  PainSc: 0-No pain         Complications: No notable events documented.

## 2023-01-02 NOTE — Anesthesia Preprocedure Evaluation (Signed)
Anesthesia Evaluation  Patient identified by MRN, date of birth, ID band Patient awake    Reviewed: Allergy & Precautions, H&P , NPO status , Patient's Chart, lab work & pertinent test results, reviewed documented beta blocker date and time   History of Anesthesia Complications Negative for: history of anesthetic complications  Airway Mallampati: III  TM Distance: >3 FB Neck ROM: full    Dental  (+) Dental Advidsory Given, Poor Dentition, Missing, Chipped, Loose   Pulmonary neg shortness of breath, asthma , Continuous Positive Airway Pressure Ventilation neg sleep apnea, neg COPD, neg recent URI, Current Smoker   Pulmonary exam normal breath sounds clear to auscultation       Cardiovascular Exercise Tolerance: Good hypertension, (-) angina (-) Past MI and (-) Cardiac Stents Normal cardiovascular exam(-) dysrhythmias (-) Valvular Problems/Murmurs Rhythm:regular Rate:Normal     Neuro/Psych negative neurological ROS  negative psych ROS   GI/Hepatic ,GERD  ,,(+) neg Cirrhosis    substance abuse  alcohol use  Endo/Other  negative endocrine ROS    Renal/GU negative Renal ROS  negative genitourinary   Musculoskeletal   Abdominal   Peds  Hematology negative hematology ROS (+)   Anesthesia Other Findings Past Medical History: No date: Asthma No date: Back pain No date: DDD (degenerative disc disease), lumbar No date: Scoliosis   Reproductive/Obstetrics negative OB ROS                             Anesthesia Physical Anesthesia Plan  ASA: 2  Anesthesia Plan: General   Post-op Pain Management:    Induction: Intravenous  PONV Risk Score and Plan: 1 and Propofol infusion and TIVA  Airway Management Planned: Natural Airway and Nasal Cannula  Additional Equipment:   Intra-op Plan:   Post-operative Plan:   Informed Consent: I have reviewed the patients History and Physical, chart,  labs and discussed the procedure including the risks, benefits and alternatives for the proposed anesthesia with the patient or authorized representative who has indicated his/her understanding and acceptance.     Dental Advisory Given  Plan Discussed with: Anesthesiologist, CRNA and Surgeon  Anesthesia Plan Comments:        Anesthesia Quick Evaluation

## 2023-01-02 NOTE — Discharge Summary (Signed)
Physician Discharge Summary   Patient: Roberto Dudley MRN: [id number] DOB: Oct 03, 1985  Admit date:     01/02/2023  Discharge date: 01/02/23  Discharge Physician: Alford Highland   PCP: Eden Emms, NP   Recommendations at discharge:   Follow-up PCP  Discharge Diagnoses: Principal Problem:   Hematemesis Active Problems:   Mallory-Weiss tear   Alcohol use disorder   Gastritis    Hospital Course: 37 year old man past medical history of alcohol abuse coming in with repeated episodes of nausea vomiting and had hematemesis.  Bowel movements were not black.  Seen by gastroenterology Dr. Mia Creek who did an endoscopy which showed a Mallory-Weiss tear which was healing.  Some gastritis seen.  As per gastroenterology okay to go home lower likelihood of rebleeding.  Assessment and Plan: * Hematemesis Secondary to numerous episodes of vomiting.  Mallory-Weiss tear Advised to hold off on alcohol and anti-inflammatories including Advil Motrin Aleve Goody powder or BC powder and any other anti-inflammatory.  Okay to take Tylenol.  GI recommended PPI daily for couple weeks.  Alcohol use disorder No signs of withdrawal.  Advised to discontinue drinking.  Patient does not drink every day.  Gastritis Discontinue alcohol and NSAIDs.  PPI.         Consultants: Gastroenterology Procedures performed: EGD Disposition: Home Diet recommendation:  Regular diet DISCHARGE MEDICATION: Allergies as of 01/02/2023       Reactions   Elemental Sulfur    Sulfa Antibiotics    Sulfites    Sulfonylureas         Medication List     STOP taking these medications    naproxen sodium 220 MG tablet Commonly known as: ALEVE       TAKE these medications    ondansetron 4 MG tablet Commonly known as: Zofran Take 1 tablet (4 mg total) by mouth every 8 (eight) hours as needed for nausea or vomiting.   pantoprazole 40 MG tablet Commonly known as: Protonix Take 1 tablet (40 mg  total) by mouth daily.        Follow-up Information     Eden Emms, NP Follow up in 5 day(s).   Specialties: Nurse Practitioner, Family Medicine Contact information: [address] [place] [address] [phone]                Discharge Exam: Ceasar Mons Weights   01/02/23 0421  Weight: 77.1 kg   Physical Exam HENT:     Head: Normocephalic.     Mouth/Throat:     Pharynx: No oropharyngeal exudate.  Eyes:     General: Lids are normal.     Conjunctiva/sclera: Conjunctivae normal.  Cardiovascular:     Rate and Rhythm: Normal rate and regular rhythm.     Heart sounds: Normal heart sounds, S1 normal and S2 normal.  Pulmonary:     Breath sounds: No decreased breath sounds, wheezing, rhonchi or rales.  Abdominal:     Palpations: Abdomen is soft.     Tenderness: There is no abdominal tenderness.  Musculoskeletal:     Right lower leg: No swelling.     Left lower leg: No swelling.  Skin:    General: Skin is warm.     Findings: No rash.  Neurological:     Mental Status: He is alert and oriented to person, place, and time.      Condition at discharge: stable  The results of significant diagnostics from this hospitalization (including imaging, microbiology, ancillary and laboratory) are listed below  for reference.     Labs: CBC: Recent Labs  Lab 01/02/23 0448  WBC 12.1*  HGB 14.7  HCT 43.1  MCV 93.1  PLT 294   Basic Metabolic Panel: Recent Labs  Lab 01/02/23 0448  NA 140  K 3.8  CL 105  CO2 23  GLUCOSE 117*  BUN 20  CREATININE 0.83  CALCIUM 9.2   Liver Function Tests: Recent Labs  Lab 01/02/23 0448  AST 38  ALT 38  ALKPHOS 79  BILITOT 0.6  PROT 7.8  ALBUMIN 4.5    Discharge time spent: greater than 30 minutes.  Signed: Alford Highland, MD [facility] 01/02/2023
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
## 2023-01-02 NOTE — Op Note (Signed)
[facility] Patient Name: Roberto Dudley Procedure Date: 01/02/2023 11:34 AM MRN: [id number] Account #: [id number] Date of Birth: April 05, 1985 Admit Type: Outpatient Age: 37 Room: [facility] ENDO ROOM 3 Gender: Male Note Status: Finalized Instrument Name: Upper Endoscope [id number] Procedure:             Upper GI endoscopy Indications:           Hematemesis Providers:             Eather Colas MD, MD Medicines:             Monitored Anesthesia Care Complications:         No immediate complications. Estimated blood loss:                         Minimal. Procedure:             Pre-Anesthesia Assessment:                        - Prior to the procedure, a History and Physical was                         performed, and patient medications and allergies were                         reviewed. The patient is competent. The risks and                         benefits of the procedure and the sedation options and                         risks were discussed with the patient. All questions                         were answered and informed consent was obtained.                         Patient identification and proposed procedure were                         verified by the physician, the nurse, the                         anesthesiologist, the anesthetist and the technician                         in the endoscopy suite. Mental Status Examination:                         alert and oriented. Airway Examination: normal                         oropharyngeal airway and neck mobility. Respiratory                         Examination: clear to auscultation. CV Examination:                         normal. Prophylactic Antibiotics: The patient does not  require prophylactic antibiotics. Prior                         Anticoagulants: The patient has taken no anticoagulant                         or antiplatelet agents. ASA Grade Assessment: II - A                          patient with mild systemic disease. After reviewing                         the risks and benefits, the patient was deemed in                         satisfactory condition to undergo the procedure. The                         anesthesia plan was to use monitored anesthesia care                         (MAC). Immediately prior to administration of                         medications, the patient was re-assessed for adequacy                         to receive sedatives. The heart rate, respiratory                         rate, oxygen saturations, blood pressure, adequacy of                         pulmonary ventilation, and response to care were                         monitored throughout the procedure. The physical                         status of the patient was re-assessed after the                         procedure.                        After obtaining informed consent, the endoscope was                         passed under direct vision. Throughout the procedure,                         the patient's blood pressure, pulse, and oxygen                         saturations were monitored continuously. The Endoscope                         was introduced through the mouth, and advanced to the  second part of duodenum. The upper GI endoscopy was                         accomplished without difficulty. The patient tolerated                         the procedure well. Findings:      A diminutive non-bleeding Mallory-Weiss tear with stigmata of recent       bleeding was found. There was a flat pigmented spot suggestive of       healing Mallory-Weiss tear.      Scattered mild inflammation characterized by erythema was found in the       gastric body. Biopsies were taken with a cold forceps for Helicobacter       pylori testing. Estimated blood loss was minimal.      The examined duodenum was normal. Impression:            - Mallory-Weiss tear.                         - Gastritis. Biopsied.                        - Normal examined duodenum. Recommendation:        - Return patient to hospital ward for possible                         discharge same day.                        - Advance diet as tolerated.                        - Use a proton pump inhibitor PO daily for 2 weeks.                        - Ok to discontinue PPI IV, octreotide, and does not                         need any further antibiotics. Procedure Code(s):     --- Professional ---                        [phone], Esophagogastroduodenoscopy, flexible,                         transoral; with biopsy, single or multiple Diagnosis Code(s):     --- Professional ---                        K22.6, Gastro-esophageal laceration-hemorrhage syndrome                        K29.70, Gastritis, unspecified, without bleeding                        K92.0, Hematemesis CPT copyright 2022 American Medical Association. All rights reserved. The codes documented in this report are preliminary and upon coder review may  be revised to meet current compliance requirements. Eather Colas MD, MD 01/02/2023 11:51:18 AM Number of Addenda: 0 Note Initiated On: 01/02/2023 11:34 AM Estimated Blood  Loss:  Estimated blood loss was minimal.      [facility]

## 2023-01-03 ENCOUNTER — Encounter: Payer: Self-pay | Admitting: Gastroenterology

## 2023-01-03 ENCOUNTER — Ambulatory Visit: Payer: Medicaid Other | Admitting: Podiatry

## 2023-01-03 VITALS — BP 145/91 | HR 70

## 2023-01-03 DIAGNOSIS — M2011 Hallux valgus (acquired), right foot: Secondary | ICD-10-CM | POA: Diagnosis not present

## 2023-01-03 LAB — SURGICAL PATHOLOGY

## 2023-01-03 NOTE — Progress Notes (Signed)
  Subjective:  Patient ID: Roberto Dudley, male    DOB: 28-Oct-1985,  MRN: [id number]  Chief Complaint  Patient presents with   Foot Pain    "That right foot hurts the most but the left one is starting to get there.  They have started to burn and sting."     37 y.o. male presents with the above complaint.  Patient presents with complaint of moderate bunion deformity.  Patient states she is noted some stinging on the right foot.  She wanted to discuss bunion treatment options.  He has not seen anyone else prior to seeing me denies any other acute complaints or started for nesting.  Pain scale is 5 out of 10 dull achy in nature but manageable.  Review of Systems: Negative except as noted in the HPI. Denies N/V/F/Ch.  Past Medical History:  Diagnosis Date   Asthma    Back pain    DDD (degenerative disc disease), lumbar    Scoliosis     Current Outpatient Medications:    ondansetron (ZOFRAN) 4 MG tablet, Take 1 tablet (4 mg total) by mouth every 8 (eight) hours as needed for nausea or vomiting., Disp: 10 tablet, Rfl: 0   pantoprazole (PROTONIX) 40 MG tablet, Take 1 tablet (40 mg total) by mouth daily., Disp: 30 tablet, Rfl: 0  Social History   Tobacco Use  Smoking Status Every Day   Current packs/day: 0.50   Average packs/day: 0.5 packs/day for 24.0 years (12.0 ttl pk-yrs)   Types: Cigarettes  Smokeless Tobacco Never    Allergies  Allergen Reactions   Elemental Sulfur    Sulfa Antibiotics    Sulfites    Sulfonylureas    Objective:   Vitals:   01/03/23 1601  BP: (!) 145/91  Pulse: 70   There is no height or weight on file to calculate BMI. Constitutional Well developed. Well nourished.  Vascular Dorsalis pedis pulses palpable bilaterally. Posterior tibial pulses palpable bilaterally. Capillary refill normal to all digits.  No cyanosis or clubbing noted. Pedal hair growth normal.  Neurologic Normal speech. Oriented to person, place, and time. Epicritic  sensation to light touch grossly present bilaterally.  Dermatologic Nails well groomed and normal in appearance. No open wounds. No skin lesions.  Orthopedic: Normal joint ROM without pain or crepitus bilaterally. Hallux abductovalgus deformity present moderate bunion deformity is noted there is a track bound not a tracking.  No intra-articular first MPJ pain Left 1st MPJ diminished range of motion. Left 1st TMT without gross hypermobility. Right 1st MPJ diminished range of motion  Right 1st TMT without gross hypermobility. Lesser digital contractures absent bilaterally.   Radiographs: Taken and reviewed.  None  Assessment:   1. Hav (hallux abducto valgus), right    Plan:  Patient was evaluated and treated and all questions answered.  Hallux abductovalgus deformity, right foot -Clinically I discussed bunion deformity and etiology and conservative care was discussed.  At this time patient will benefit from chevron osteotomy with possible phalangeal osteotomy I briefly discussed surgical options he like to think about and will get back to me when he is ready.  No follow-ups on file.

## 2023-01-09 ENCOUNTER — Ambulatory Visit: Payer: Medicaid Other | Admitting: Nurse Practitioner

## 2023-01-09 NOTE — Anesthesia Postprocedure Evaluation (Signed)
Anesthesia Post Note  Patient: Roberto Dudley  Procedure(s) Performed: ESOPHAGOGASTRODUODENOSCOPY (EGD) WITH PROPOFOL  Patient location during evaluation: Endoscopy Anesthesia Type: General Level of consciousness: awake and alert Pain management: pain level controlled Vital Signs Assessment: post-procedure vital signs reviewed and stable Respiratory status: spontaneous breathing, nonlabored ventilation, respiratory function stable and patient connected to nasal cannula oxygen Cardiovascular status: blood pressure returned to baseline and stable Postop Assessment: no apparent nausea or vomiting Anesthetic complications: no   No notable events documented.   Last Vitals:  Vitals:   01/02/23 1158 01/02/23 1208  BP: (!) 139/96 (!) 140/105  Pulse: 74 85  Resp:    Temp: (!) 36.2 C   SpO2: 95% 100%    Last Pain:  Vitals:   01/02/23 1208  TempSrc:   PainSc: 0-No pain                 Lenard Simmer

## 2023-01-20 ENCOUNTER — Ambulatory Visit: Payer: Medicaid Other | Admitting: Nurse Practitioner

## 2023-01-23 ENCOUNTER — Encounter: Payer: Self-pay | Admitting: Nurse Practitioner

## 2023-02-16 ENCOUNTER — Ambulatory Visit: Payer: Medicaid Other | Admitting: Nurse Practitioner

## 2023-07-28 ENCOUNTER — Ambulatory Visit: Admitting: Nurse Practitioner

## 2023-07-28 VITALS — BP 140/90 | HR 88 | Temp 98.2°F | Ht 63.5 in | Wt 158.6 lb

## 2023-07-28 DIAGNOSIS — I10 Essential (primary) hypertension: Secondary | ICD-10-CM | POA: Insufficient documentation

## 2023-07-28 DIAGNOSIS — R0789 Other chest pain: Secondary | ICD-10-CM | POA: Insufficient documentation

## 2023-07-28 DIAGNOSIS — G4700 Insomnia, unspecified: Secondary | ICD-10-CM | POA: Insufficient documentation

## 2023-07-28 DIAGNOSIS — Z1159 Encounter for screening for other viral diseases: Secondary | ICD-10-CM

## 2023-07-28 DIAGNOSIS — Z114 Encounter for screening for human immunodeficiency virus [HIV]: Secondary | ICD-10-CM

## 2023-07-28 DIAGNOSIS — R42 Dizziness and giddiness: Secondary | ICD-10-CM | POA: Diagnosis not present

## 2023-07-28 DIAGNOSIS — R202 Paresthesia of skin: Secondary | ICD-10-CM | POA: Insufficient documentation

## 2023-07-28 DIAGNOSIS — F1010 Alcohol abuse, uncomplicated: Secondary | ICD-10-CM

## 2023-07-28 MED ORDER — AMLODIPINE BESYLATE 5 MG PO TABS: 5.0000 mg | ORAL_TABLET | Freq: Every day | ORAL | 0 refills | Status: DC

## 2023-07-28 MED ORDER — HYDROXYZINE PAMOATE 25 MG PO CAPS: 25.0000 mg | ORAL_CAPSULE | Freq: Every evening | ORAL | 0 refills | Status: DC | PRN
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
## 2023-07-28 NOTE — Patient Instructions (Signed)
 Nice to see you today I will be in touch with the labs once I have reviewed them Follow up with me in 1 month, sooner If you need me

## 2023-07-28 NOTE — Assessment & Plan Note (Signed)
 Patient is having several readings in chart with elevated blood pressure.  Patient having intermittent lightheadedness query this related to hypertension.  Will start patient on amlodipine 5mg  daily and have patient follow-up in 1 month

## 2023-07-28 NOTE — Assessment & Plan Note (Signed)
 Patient currently drinking 10-12 beers a day

## 2023-07-28 NOTE — Assessment & Plan Note (Signed)
 Likely secondary to alcohol consumption and EKG did show a right bundle branch block.
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
## 2023-07-28 NOTE — Assessment & Plan Note (Signed)
 Multifactorial.  Patient is drinking 10-12 beers at night.  He has tried trazodone and failed we will try hydroxyzine 25 mg nightly as needed.  Query having a mood disorder as patient was sent for many days of time consider use of Seroquel as

## 2023-07-31 LAB — COMPREHENSIVE METABOLIC PANEL WITH GFR
AG Ratio: 1.5 (calc) (ref 1.0–2.5)
ALT: 26 U/L (ref 9–46)
AST: 19 U/L (ref 10–40)
Albumin: 4.4 g/dL (ref 3.6–5.1)
Alkaline phosphatase (APISO): 73 U/L (ref 36–130)
BUN: 14 mg/dL (ref 7–25)
CO2: 25 mmol/L (ref 20–32)
Calcium: 9.7 mg/dL (ref 8.6–10.3)
Chloride: 108 mmol/L (ref 98–110)
Creat: 0.99 mg/dL (ref 0.60–1.26)
Globulin: 2.9 g/dL (ref 1.9–3.7)
Glucose, Bld: 91 mg/dL (ref 65–99)
Potassium: 4.8 mmol/L (ref 3.5–5.3)
Sodium: 141 mmol/L (ref 135–146)
Total Bilirubin: 0.5 mg/dL (ref 0.2–1.2)
Total Protein: 7.3 g/dL (ref 6.1–8.1)
eGFR: 100 mL/min/{1.73_m2} (ref >=60)

## 2023-07-31 LAB — CBC
HCT: 42.1 % (ref 38.5–50.0)
Hemoglobin: 14.1 g/dL (ref 13.2–17.1)
MCH: 31.5 pg (ref 27.0–33.0)
MCHC: 33.5 g/dL (ref 32.0–36.0)
MCV: 94 fL (ref 80.0–100.0)
MPV: 10.9 fL (ref 7.5–12.5)
Platelets: 268 10*3/uL (ref 140–400)
RBC: 4.48 10*6/uL (ref 4.20–5.80)
RDW: 12.4 % (ref 11.0–15.0)
WBC: 8.4 10*3/uL (ref 3.8–10.8)

## 2023-07-31 LAB — HEPATITIS C ANTIBODY: Hepatitis C Ab: REACTIVE — AB

## 2023-07-31 LAB — HIV ANTIBODY (ROUTINE TESTING W REFLEX): HIV 1&2 Ab, 4th Generation: NONREACTIVE

## 2023-07-31 LAB — FOLATE: Folate: 11.9 ng/mL

## 2023-07-31 LAB — HCV RNA,QUANTITATIVE REAL TIME PCR
HCV Quantitative Log: <1.18 {Log_IU}/mL
HCV RNA, PCR, QN: <15 [IU]/mL

## 2023-07-31 LAB — TSH: TSH: 0.91 m[IU]/L (ref 0.40–4.50)

## 2023-07-31 LAB — VITAMIN B12: Vitamin B-12: 444 pg/mL (ref 200–1100)

## 2023-08-02 ENCOUNTER — Ambulatory Visit: Payer: Self-pay | Admitting: Nurse Practitioner

## 2023-08-05 ENCOUNTER — Encounter: Payer: Self-pay | Admitting: Nurse Practitioner

## 2023-08-07 ENCOUNTER — Telehealth: Payer: Self-pay

## 2023-08-07 NOTE — Telephone Encounter (Signed)
 Patient has not been evaluated for this. He will need an office visit

## 2023-08-07 NOTE — Telephone Encounter (Signed)
 Called number on file, a male answered stating pt was currently not available, he was on his way to the hosp. Stated he'd have pt call office back.

## 2023-08-07 NOTE — Telephone Encounter (Signed)
 Copied from CRM [id number]. Topic: General - Call Back - No Documentation >> Aug 07, 2023  3:52 PM Melissa C wrote: Reason for CRM: patient received a call but there was no note that Access Specialist could relay. I did see that patient had lab results but there may be follow up questions for clinical team in this case so I did not relay. Please advise with patient. Thank you

## 2023-08-08 NOTE — Telephone Encounter (Signed)
 Sent pt mychart message regarding cardiologist inquiry.

## 2023-08-10 ENCOUNTER — Other Ambulatory Visit: Payer: Self-pay | Admitting: Nurse Practitioner

## 2023-08-10 DIAGNOSIS — R0789 Other chest pain: Secondary | ICD-10-CM

## 2023-08-24 ENCOUNTER — Other Ambulatory Visit: Payer: Self-pay | Admitting: Nurse Practitioner

## 2023-08-24 DIAGNOSIS — G4700 Insomnia, unspecified: Secondary | ICD-10-CM

## 2023-08-25 ENCOUNTER — Ambulatory Visit: Admitting: Nurse Practitioner

## 2023-08-30 ENCOUNTER — Ambulatory Visit: Admitting: Nurse Practitioner

## 2023-08-30 VITALS — BP 126/90 | HR 85 | Temp 97.8°F | Ht 63.5 in | Wt 161.2 lb

## 2023-08-30 DIAGNOSIS — I10 Essential (primary) hypertension: Secondary | ICD-10-CM

## 2023-08-30 DIAGNOSIS — G4700 Insomnia, unspecified: Secondary | ICD-10-CM

## 2023-08-30 DIAGNOSIS — F1010 Alcohol abuse, uncomplicated: Secondary | ICD-10-CM | POA: Diagnosis not present

## 2023-08-30 MED ORDER — AMLODIPINE BESYLATE 10 MG PO TABS: 10.0000 mg | ORAL_TABLET | Freq: Every day | ORAL | 0 refills | Status: DC

## 2023-08-30 MED ORDER — QUETIAPINE FUMARATE 25 MG PO TABS: 25.0000 mg | ORAL_TABLET | Freq: Every day | ORAL | 0 refills | Status: AC

## 2023-08-30 MED ORDER — PANTOPRAZOLE SODIUM 40 MG PO TBEC: 40.0000 mg | DELAYED_RELEASE_TABLET | Freq: Every day | ORAL | 0 refills | Status: DC

## 2023-08-30 NOTE — Assessment & Plan Note (Signed)
 Blood pressure improving but not at goal yet.  We will get increase amlodipine  from 5 mg to 10 mg.  Continue checking blood pressure at home lightheadedness/dizziness has resolved

## 2023-08-30 NOTE — Assessment & Plan Note (Signed)
 Patient has tried and failed trazodone and hydroxyzine .  Will trial Seroquel 25 mg nightly.  Patient was directed not to take this medication on nights that he is drinking.  Did review most recent EKG.  If after 1 week 25 mg is not sufficient he can increase to 50 mg at night again to avoid on nights that he drinks.

## 2023-08-30 NOTE — Patient Instructions (Signed)
 Nice to see you today I have increased your amlodipine  (blood pressure medication) to 10mg  a day STOP the hydroxyzine  and try the Seroquel. Take one tablet before bed for a week if it is not helping you can take 2 tablets (total of 50mg ). DO NOT take on the nights that you are drinking

## 2023-08-30 NOTE — Progress Notes (Signed)
 Established Patient Office Visit  Subjective   Patient ID: Roberto Dudley, male    DOB: [birth year]  Age: 38 y.o. MRN: [id number]  Chief Complaint  Patient presents with   Follow-up    Pt complains that lightheadedness has not been a concern but pt states BP is still high. States of 150/100 readings at home. Pt complains that medication for sleep is not helping. States of getting 4 hours of sleep each night.    Medication Refill    Pantoprazole      HPI  HTN: Patient was seen by me on 07/28/2023.  Patient had several elevated blood pressure readings and was diagnosed with hypertension.  He was started on amlodipine  5 mg daily and is here for follow-up. He is checking his blood pressure at home intermittently. He is 150s on the top 100-103s. States that he is asymptomatic  Insomnia: Patient having difficulty sleeping.  He is drinking approximately 10-12 beers at night.  He has tried trazodone in the past without relief.  Will try patient on hydroxyzine  25 mg nightly as needed.  If not beneficial we can consider Seroquel. States that he has taken it and it has not helped. States that he is slowing down on his alcohol consumption. He is dirnking 2 times a week vs 4 times a week. Statse that he can go to sleep when he drinks.     Review of Systems  Constitutional:  Negative for chills and fever.  Respiratory:  Negative for shortness of breath.   Cardiovascular:  Negative for chest pain.  Neurological:  Negative for dizziness and headaches.  Psychiatric/Behavioral:  Negative for hallucinations and suicidal ideas. The patient has insomnia.       Objective:     BP (!) 126/90   Pulse 85   Temp 97.8 F (36.6 C) (Oral)   Ht 5' 3.5 (1.613 m)   Wt 161 lb 3.2 oz (73.1 kg)   SpO2 95%   BMI 28.11 kg/m  BP Readings from Last 3 Encounters:  08/30/23 (!) 126/90  07/28/23 (!) 140/90  01/03/23 (!) 145/91   Wt Readings from Last 3 Encounters:  08/30/23 161 lb 3.2 oz (73.1 kg)   07/28/23 158 lb 9.6 oz (71.9 kg)  01/02/23 170 lb (77.1 kg)   SpO2 Readings from Last 3 Encounters:  08/30/23 95%  07/28/23 95%  01/02/23 100%      Physical Exam Vitals and nursing note reviewed.  Constitutional:      Appearance: Normal appearance.   Cardiovascular:     Rate and Rhythm: Normal rate and regular rhythm.     Heart sounds: Normal heart sounds.  Pulmonary:     Effort: Pulmonary effort is normal.     Breath sounds: Normal breath sounds.  Abdominal:     Comments: BS WNL   Neurological:     Mental Status: He is alert.      No results found for any visits on 08/30/23.    The ASCVD Risk score (Arnett DK, et al., 2019) failed to calculate for the following reasons:   The 2019 ASCVD risk score is only valid for ages 79 to 31    Assessment & Plan:   Problem List Items Addressed This Visit       Cardiovascular and Mediastinum   Primary hypertension - Primary   Blood pressure improving but not at goal yet.  We will get increase amlodipine  from 5 mg to 10 mg.  Continue checking blood pressure at home lightheadedness/dizziness  has resolved      Relevant Medications   amLODipine  (NORVASC ) 10 MG tablet     Other   Alcohol abuse   Patient is wife injecting 4 times a week to 2 times a week but volume is still the same.  Continue working on slowly weaning alcohol use      Insomnia   Patient has tried and failed trazodone and hydroxyzine .  Will trial Seroquel 25 mg nightly.  Patient was directed not to take this medication on nights that he is drinking.  Did review most recent EKG.  If after 1 week 25 mg is not sufficient he can increase to 50 mg at night again to avoid on nights that he drinks.      Relevant Medications   QUEtiapine (SEROQUEL) 25 MG tablet    Return in about 6 weeks (around 10/11/2023) for Sleep/HTN.    Adina Crandall, NP

## 2023-08-30 NOTE — Assessment & Plan Note (Signed)
 Patient is wife injecting 4 times a week to 2 times a week but volume is still the same.  Continue working on slowly weaning alcohol use

## 2023-09-01 ENCOUNTER — Telehealth: Payer: Self-pay

## 2023-09-01 ENCOUNTER — Other Ambulatory Visit (HOSPITAL_COMMUNITY): Payer: Self-pay

## 2023-09-01 NOTE — Telephone Encounter (Signed)
 Pharmacy Patient Advocate Encounter   Received notification from CoverMyMeds that prior authorization for Quetiapine  fum 25 tabs is required/requested.   Insurance verification completed.   The patient is insured through [facility] .   Per test claim: PA required and submitted KEY/EOC/Request #: [id number] from 09/01/23 to 08/31/24. Ran test claim, Copay is $4.00. This test claim was processed through [facility]- copay amounts may vary at other pharmacies due to pharmacy/plan contracts, or as the patient moves through the different stages of their insurance plan.

## 2023-09-08 ENCOUNTER — Other Ambulatory Visit: Payer: Self-pay | Admitting: Nurse Practitioner

## 2023-09-08 DIAGNOSIS — G4700 Insomnia, unspecified: Secondary | ICD-10-CM

## 2023-09-25 ENCOUNTER — Other Ambulatory Visit: Payer: Self-pay | Admitting: Nurse Practitioner

## 2023-09-25 DIAGNOSIS — G4700 Insomnia, unspecified: Secondary | ICD-10-CM

## 2023-10-11 ENCOUNTER — Ambulatory Visit: Admitting: Nurse Practitioner

## 2023-10-11 VITALS — BP 108/78 | HR 64 | Temp 98.3°F | Ht 63.5 in | Wt 163.2 lb

## 2023-10-11 DIAGNOSIS — I10 Essential (primary) hypertension: Secondary | ICD-10-CM | POA: Diagnosis not present

## 2023-10-11 DIAGNOSIS — G4700 Insomnia, unspecified: Secondary | ICD-10-CM

## 2023-10-11 DIAGNOSIS — L0291 Cutaneous abscess, unspecified: Secondary | ICD-10-CM | POA: Diagnosis not present

## 2023-10-11 DIAGNOSIS — F1010 Alcohol abuse, uncomplicated: Secondary | ICD-10-CM

## 2023-10-11 MED ORDER — DOXYCYCLINE HYCLATE 100 MG PO TABS: 100.0000 mg | ORAL_TABLET | Freq: Two times a day (BID) | ORAL | 0 refills | Status: AC

## 2023-10-11 NOTE — Patient Instructions (Signed)
 Nice to see you today I have sent in the antibiotics to the pharmacy  Follow up with me if you do not improve You can take a total of 50mg  of seroquel  at night to see if that helps with your sleep Follow up with me in approx 9 months, sooner if you need me

## 2023-10-11 NOTE — Assessment & Plan Note (Signed)
 Patient unable to check blood pressure at home but on amlodipine  10 mg daily.  Pressure well-controlled.  Patient tolerating medication well continue medication as prescribed

## 2023-10-11 NOTE — Assessment & Plan Note (Signed)
 Multifactorial at that juncture.  Seroquel  worked in the beginning for couple days.  Encourage patient to increase to 50 mg nightly he will reach out to me if that is ineffective.

## 2023-10-11 NOTE — Progress Notes (Signed)
 Established Patient Office Visit  Subjective   Patient ID: Roberto Dudley, male    DOB: [birth year]  Age: 38 y.o. MRN: [id number]  Chief Complaint  Patient presents with   Follow-up    Pt complains of not getting much sleep. States of 4-5 hours a night.     HPI  HTN: Patient was last seen by me on 08/30/2023 identified patient was having elevated blood pressure.  He was on amlodipine  5 mg that was increased to 10 mg daily.  He is here for follow-up. Has been checking it at home. No ADE  Alcohol abuse.  Patient is still drinking 2-4 times a week but still the same volume.  He is working on reduction. States that he is 2 weeks sober.  Insomnia: Patient has tried and failed trazodone and hydroxyzine .  We did start with Seroquel  25 mg nightly.  If after 1 week if it was ineffective he was encouraged to increase to 50 mg nightly. State that eh kids have been keeping him up.  He has been taking the seroquel  at 25mg . States the first time he used it it helped. He will go to bed around 10 and will get up around 530-6. Statse that he is getting an average of 4-5 hours at night.   States that he has a boil in his groin area. States that it a larger size. States that it has been approx 1 month. States that it has not drained that he is aware of. States that it does hurt with pressure and he will also noticed that it has an opening     Review of Systems  Constitutional:  Negative for chills and fever.  Respiratory:  Negative for shortness of breath.   Cardiovascular:  Negative for chest pain.  Gastrointestinal:  Positive for constipation.  Neurological:  Negative for dizziness and headaches.  Psychiatric/Behavioral:  Negative for hallucinations and suicidal ideas.       Objective:     BP 108/78   Pulse 64   Temp 98.3 F (36.8 C) (Oral)   Ht 5' 3.5 (1.613 m)   Wt 163 lb 3.2 oz (74 kg)   SpO2 95%   BMI 28.46 kg/m    Physical Exam Vitals and nursing note reviewed. Exam  conducted with a chaperone present Royden lemons, CMA).  Constitutional:      Appearance: Normal appearance.  Cardiovascular:     Rate and Rhythm: Normal rate and regular rhythm.     Heart sounds: Normal heart sounds.  Pulmonary:     Effort: Pulmonary effort is normal.     Breath sounds: Normal breath sounds.  Genitourinary:   Neurological:     Mental Status: He is alert.      No results found for any visits on 10/11/23.    The ASCVD Risk score (Arnett DK, et al., 2019) failed to calculate for the following reasons:   The 2019 ASCVD risk score is only valid for ages 3 to 83    Assessment & Plan:   Problem List Items Addressed This Visit       Cardiovascular and Mediastinum   Primary hypertension   Patient unable to check blood pressure at home but on amlodipine  10 mg daily.  Pressure well-controlled.  Patient tolerating medication well continue medication as prescribed        Other   Alcohol abuse   Patient is currently 2 weeks sober praise given in office continue sobriety      Insomnia  Multifactorial at that juncture.  Seroquel  worked in the beginning for couple days.  Encourage patient to increase to 50 mg nightly he will reach out to me if that is ineffective.      Abscess - Primary   Doxycycline  100 mg twice daily      Relevant Medications   doxycycline  (VIBRA -TABS) 100 MG tablet    Return in about 9 months (around 07/10/2024) for CPE and Labs.    Adina Crandall, NP

## 2023-10-11 NOTE — Assessment & Plan Note (Signed)
 Patient is currently 2 weeks sober praise given in office continue sobriety

## 2023-10-11 NOTE — Assessment & Plan Note (Signed)
Doxycycline 100 mg twice daily

## 2023-10-19 ENCOUNTER — Telehealth: Payer: Self-pay | Admitting: *Deleted

## 2023-10-19 NOTE — Telephone Encounter (Signed)
 Unable to LVM to verify card hx.

## 2023-10-20 ENCOUNTER — Ambulatory Visit: Attending: Cardiology | Admitting: Cardiology

## 2023-10-20 ENCOUNTER — Encounter: Payer: Self-pay | Admitting: Cardiology

## 2023-10-20 VITALS — BP 100/65 | HR 74 | Ht 64.0 in | Wt 163.6 lb

## 2023-10-20 DIAGNOSIS — I10 Essential (primary) hypertension: Secondary | ICD-10-CM | POA: Diagnosis not present

## 2023-10-20 DIAGNOSIS — F17200 Nicotine dependence, unspecified, uncomplicated: Secondary | ICD-10-CM | POA: Diagnosis not present

## 2023-10-20 DIAGNOSIS — R072 Precordial pain: Secondary | ICD-10-CM | POA: Insufficient documentation

## 2023-10-20 MED ORDER — METOPROLOL TARTRATE 100 MG PO TABS: ORAL_TABLET | ORAL | 0 refills | Status: AC
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
## 2023-10-20 NOTE — Patient Instructions (Signed)
 Medication Instructions:  - Take ONE 100 mg metoprolol two hours prior to cardiac CT  *If you need a refill on your cardiac medications before your next appointment, please call your pharmacy*  Lab Work: Your provider would like for you to have following labs drawn today BMP.    If you have labs (blood work) drawn today and your tests are completely normal, you will receive your results only by: MyChart Message (if you have MyChart) OR A paper copy in the mail If you have any lab test that is abnormal or we need to change your treatment, we will call you to review the results.  Testing/Procedures: Your physician has requested that you have an echocardiogram. Echocardiography is a painless test that uses sound waves to create images of your heart. It provides your doctor with information about the size and shape of your heart and how well your heart's chambers and valves are working.   You may receive an ultrasound enhancing agent through an IV if needed to better visualize your heart during the echo. This procedure takes approximately one hour.  There are no restrictions for this procedure.  This will take place at [address] [facility] [address]) [address], [place] [address]  Please note: We ask at that you not bring children with you during ultrasound (echo/ vascular) testing. Due to room size and safety concerns, children are not allowed in the ultrasound rooms during exams. Our front office staff cannot provide observation of children in our lobby area while testing is being conducted. An adult accompanying a patient to their appointment will only be allowed in the ultrasound room at the discretion of the ultrasound technician under special circumstances. We apologize for any inconvenience.   Your cardiac CT will be scheduled at:  [facility] [address] [place], [place] [address] [phone]  Please arrive 15 mins early for check-in and test  prep.  There is spacious parking and easy access to the radiology department from the [facility] Heart and Vascular entrance. Please enter here and check-in with the desk attendant.    Please follow these instructions carefully (unless otherwise directed):  An IV will be required for this test and Nitroglycerin will be given.  Hold all erectile dysfunction medications at least 3 days (72 hrs) prior to test. (Ie viagra, cialis, sildenafil, tadalafil, etc)    On the Night Before the Test: Be sure to Drink plenty of water. Do not consume any caffeinated/decaffeinated beverages or chocolate 12 hours prior to your test. Do not take any antihistamines 12 hours prior to your test.  On the Day of the Test: Drink plenty of water until 1 hour prior to the test. Do not eat any food 1 hour prior to test. You may take your regular medications prior to the test.  Take metoprolol (Lopressor) two hours prior to test. If you take Furosemide/Hydrochlorothiazide/Spironolactone/Chlorthalidone, please HOLD on the morning of the test. Patients who wear a continuous glucose monitor MUST remove the device prior to scanning. FEMALES- please wear underwire-free bra if available, avoid dresses & tight clothing       After the Test: Drink plenty of water. After receiving IV contrast, you may experience a mild flushed feeling. This is normal. On occasion, you may experience a mild rash up to 24 hours after the test. This is not dangerous. If this occurs, you can take Benadryl 25 mg, Zyrtec, Claritin, or Allegra and increase your fluid intake. (Patients taking Tikosyn should avoid  Benadryl, and may take Zyrtec, Claritin, or Allegra) If you experience trouble breathing, this can be serious. If it is severe call 911 IMMEDIATELY. If it is mild, please call our office.  We will call to schedule your test 2-4 weeks out understanding that some insurance companies will need an authorization prior to the service being  performed.   For more information and frequently asked questions, please visit our website : [email or web address]  For non-scheduling related questions, please contact the cardiac imaging nurse navigator should you have any questions/concerns: Cardiac Imaging Nurse Navigators Direct Office Dial: [phone]   For scheduling needs, including cancellations and rescheduling, please call [place], [phone].    Follow-Up: At [facility], you and your health needs are our priority.  As part of our continuing mission to provide you with exceptional heart care, our providers are all part of one team.  This team includes your primary Cardiologist (physician) and Advanced Practice Providers or APPs (Physician Assistants and Nurse Practitioners) who all work together to provide you with the care you need, when you need it.  Your next appointment:   3 month(s)  Provider:   You may see Dr. Darliss or one of the following Advanced Practice Providers on your designated Care Team:   Lonni Meager, NP Lesley Maffucci, PA-C Bernardino Bring, PA-C Cadence Bishop, PA-C Tylene Lunch, NP Barnie Hila, NP    We recommend signing up for the patient portal called MyChart.  Sign up information is provided on this After Visit Summary.  MyChart is used to connect with patients for Virtual Visits (Telemedicine).  Patients are able to view lab/test results, encounter notes, upcoming appointments, etc.  Non-urgent messages can be sent to your provider as well.   To learn more about what you can do with MyChart, go to [email or web address].

## 2023-10-21 LAB — BASIC METABOLIC PANEL WITH GFR
BUN/Creatinine Ratio: 17 (ref 9–20)
BUN: 16 mg/dL (ref 6–20)
CO2: 21 mmol/L (ref 20–29)
Calcium: 9.5 mg/dL (ref 8.7–10.2)
Chloride: 106 mmol/L (ref 96–106)
Creatinine, Ser: 0.94 mg/dL (ref 0.76–1.27)
Glucose: 89 mg/dL (ref 70–99)
Potassium: 4.3 mmol/L (ref 3.5–5.2)
Sodium: 142 mmol/L (ref 134–144)
eGFR: 106 mL/min/1.73 (ref >59)

## 2023-10-23 ENCOUNTER — Other Ambulatory Visit: Payer: Self-pay | Admitting: Nurse Practitioner

## 2023-10-23 DIAGNOSIS — R42 Dizziness and giddiness: Secondary | ICD-10-CM

## 2023-10-23 DIAGNOSIS — I10 Essential (primary) hypertension: Secondary | ICD-10-CM

## 2023-11-18 ENCOUNTER — Encounter (HOSPITAL_COMMUNITY): Payer: Self-pay

## 2023-11-24 ENCOUNTER — Ambulatory Visit

## 2023-11-27 ENCOUNTER — Other Ambulatory Visit: Payer: Self-pay | Admitting: Nurse Practitioner

## 2023-11-27 DIAGNOSIS — I10 Essential (primary) hypertension: Secondary | ICD-10-CM

## 2023-12-15 ENCOUNTER — Ambulatory Visit: Attending: Nurse Practitioner | Admitting: Nurse Practitioner

## 2023-12-15 ENCOUNTER — Telehealth: Payer: Self-pay | Admitting: Emergency Medicine

## 2023-12-15 NOTE — Telephone Encounter (Signed)
 Called and left a voicemail for the patient to call back.  Call back number provided.

## 2023-12-15 NOTE — Progress Notes (Deleted)
 Cardiology Office Note    Date:  12/15/2023   ID:  Roberto Dudley, DOB 1985-08-05, MRN [id number]  PCP:  Wendee Lynwood HERO, NP  Cardiologist:  None  Electrophysiologist:  None   Chief Complaint: Follow-up  History of Present Illness:   Roberto Dudley is a 38 y.o. male with history of asthma, hypertension, GERD, and tobacco abuse x 25+ years who presents for follow-up after testing.  Patient was seen by Dr. Darliss 10/20/2023 for evaluation of chest pain.  He reported that this had been ongoing for about 6 months.  The pain is centrally located and not usually associated with exertion.  Stretching his arms occasionally relieved the pain/pressure.  He reported a family history of father who had a heart attack in his 30s.  Right bundle branch block was noted on EKG. Echo and coronary CTA were ordered and are both pending.   ***  Labs independently reviewed: ***  Objective   Past Medical History:  Diagnosis Date   Asthma    Back pain    DDD (degenerative disc disease), lumbar    Scoliosis     Current Medications: No outpatient medications have been marked as taking for the 12/15/23 encounter (Appointment) with Vivienne Lonni Ingle, NP.    Allergies:   Elemental sulfur, Sulfa  antibiotics, Sulfites, and Sulfonylureas   Social History   Socioeconomic History   Marital status: Married    Spouse name: Not on file   Number of children: 3   Years of education: Not on file   Highest education level: 10th grade  Occupational History   Not on file  Tobacco Use   Smoking status: Every Day    Current packs/day: 0.50    Average packs/day: 0.5 packs/day for 24.0 years (12.0 ttl pk-yrs)    Types: Cigarettes   Smokeless tobacco: Never  Vaping Use   Vaping status: Never Used  Substance and Sexual Activity   Alcohol use: Yes    Comment: 12 beers a day   Drug use: Yes    Types: Marijuana    Comment: Daily. For pain   Sexual activity: Yes    Birth  control/protection: Condom  Other Topics Concern   Not on file  Social History Narrative   Fulltime: [employer]   Social Drivers of Health   Financial Resource Strain: Patient Declined (10/08/2023)   Overall Financial Resource Strain (CARDIA)    Difficulty of Paying Living Expenses: Patient declined  Food Insecurity: Patient Declined (10/08/2023)   Hunger Vital Sign    Worried About Running Out of Food in the Last Year: Patient declined    Ran Out of Food in the Last Year: Patient declined  Transportation Needs: Patient Declined (10/08/2023)   PRAPARE - Administrator, Civil Service (Medical): Patient declined    Lack of Transportation (Non-Medical): Patient declined  Physical Activity: Insufficiently Active (10/08/2023)   Exercise Vital Sign    Days of Exercise per Week: 2 days    Minutes of Exercise per Session: 30 min  Stress: Stress Concern Present (10/08/2023)   [employer] of Occupational Health - Occupational Stress Questionnaire    Feeling of Stress: To some extent  Social Connections: Unknown (10/08/2023)   Social Connection and Isolation Panel    Frequency of Communication with Friends and Family: Patient declined    Frequency of Social Gatherings with Friends and Family: Never    Attends Religious Services: Patient declined    Database administrator or Organizations:  No    Attends Banker Meetings: Not on file    Marital Status: Patient declined     Family History:  The patient's family history includes COPD in his maternal aunt; Dementia in his mother; Heart attack in his father; Heart failure in his father; Hyperlipidemia in his father; Hypertension in his father; Kidney disease in his father; Leukemia in his maternal uncle.  ROS:   12-point review of systems is negative unless otherwise noted in the HPI.  EKGs/Other Studies Reviewed:    Studies reviewed were summarized above. The additional studies were reviewed today: ***  EKG:  EKG  personally reviewed by me today    PHYSICAL EXAM:    VS:  There were no vitals taken for this visit.  BMI: There is no height or weight on file to calculate BMI.  GEN: Well nourished, well developed in no acute distress NECK: No JVD; No carotid bruits CARDIAC: ***RRR, no murmurs, rubs, gallops RESPIRATORY:  Clear to auscultation without rales, wheezing or rhonchi  ABDOMEN: Soft, non-tender, non-distended EXTREMITIES:  *** No edema; No deformity  Wt Readings from Last 3 Encounters:  10/20/23 163 lb 9.6 oz (74.2 kg)  10/11/23 163 lb 3.2 oz (74 kg)  08/30/23 161 lb 3.2 oz (73.1 kg)                  ASSESSMENT & PLAN:   ***   {Are you ordering a CV Procedure (e.g. stress test, cath, DCCV, TEE, etc)?   Press F2        :[id number]}   Disposition: F/u with Dr. Darliss or an APP in ***.   Medication Adjustments/Labs and Tests Ordered: Current medicines are reviewed at length with the patient today.  Concerns regarding medicines are outlined above. Medication changes, Labs and Tests ordered today are summarized above and listed in the Patient Instructions accessible in Encounters.   Signed, Lesley Maffucci, PA-C 12/15/2023 12:28 PM     [facility] [place] [address] [place], [place] [address] [phone]

## 2023-12-19 ENCOUNTER — Ambulatory Visit: Attending: Cardiology

## 2023-12-19 DIAGNOSIS — R072 Precordial pain: Secondary | ICD-10-CM

## 2023-12-19 LAB — ECHOCARDIOGRAM COMPLETE
AR max vel: 2.07 cm2
AV Area VTI: 2.02 cm2
AV Area mean vel: 1.95 cm2
AV Mean grad: 5 mmHg
AV Peak grad: 9.1 mmHg
Ao pk vel: 1.51 m/s
Area-P 1/2: 3.76 cm2
S' Lateral: 3.3 cm

## 2023-12-21 ENCOUNTER — Ambulatory Visit: Payer: Self-pay | Admitting: Cardiology

## 2024-01-01 ENCOUNTER — Ambulatory Visit: Admitting: Cardiology

## 2024-01-11 ENCOUNTER — Other Ambulatory Visit

## 2024-02-05 ENCOUNTER — Ambulatory Visit: Admitting: Cardiology

## 2024-02-13 ENCOUNTER — Encounter: Payer: Self-pay | Admitting: Emergency Medicine

## 2024-02-13 ENCOUNTER — Emergency Department: Admission: EM | Admit: 2024-02-13 | Discharge: 2024-02-13 | Disposition: A | Discharge status: Home / Self Care

## 2024-02-13 ENCOUNTER — Emergency Department

## 2024-02-13 ENCOUNTER — Other Ambulatory Visit: Payer: Self-pay

## 2024-02-13 DIAGNOSIS — L02214 Cutaneous abscess of groin: Secondary | ICD-10-CM | POA: Diagnosis not present

## 2024-02-13 DIAGNOSIS — N492 Inflammatory disorders of scrotum: Secondary | ICD-10-CM | POA: Diagnosis not present

## 2024-02-13 LAB — CBC WITH DIFFERENTIAL/PLATELET
Abs Immature Granulocytes: 0.02 K/uL (ref 0.00–0.07)
Basophils Absolute: 0.1 K/uL (ref 0.0–0.1)
Basophils Relative: 1 %
Eosinophils Absolute: 0.5 K/uL (ref 0.0–0.5)
Eosinophils Relative: 5 %
HCT: 40.9 % (ref 39.0–52.0)
Hemoglobin: 13.9 g/dL (ref 13.0–17.0)
Immature Granulocytes: 0 %
Lymphocytes Relative: 28 %
Lymphs Abs: 2.5 K/uL (ref 0.7–4.0)
MCH: 30.9 pg (ref 26.0–34.0)
MCHC: 34 g/dL (ref 30.0–36.0)
MCV: 90.9 fL (ref 80.0–100.0)
Monocytes Absolute: 0.8 K/uL (ref 0.1–1.0)
Monocytes Relative: 10 %
Neutro Abs: 4.9 K/uL (ref 1.7–7.7)
Neutrophils Relative %: 56 %
Platelets: 242 K/uL (ref 150–400)
RBC: 4.5 MIL/uL (ref 4.22–5.81)
RDW: 12.7 % (ref 11.5–15.5)
WBC: 8.7 K/uL (ref 4.0–10.5)
nRBC: 0 % (ref 0.0–0.2)

## 2024-02-13 LAB — BASIC METABOLIC PANEL WITH GFR
Anion gap: 12 (ref 5–15)
BUN: 10 mg/dL (ref 6–20)
CO2: 23 mmol/L (ref 22–32)
Calcium: 9.7 mg/dL (ref 8.9–10.3)
Chloride: 106 mmol/L (ref 98–111)
Creatinine, Ser: 0.81 mg/dL (ref 0.61–1.24)
GFR, Estimated: >60 mL/min (ref >60)
Glucose, Bld: 93 mg/dL (ref 70–99)
Potassium: 4.3 mmol/L (ref 3.5–5.1)
Sodium: 141 mmol/L (ref 135–145)

## 2024-02-13 MED ORDER — OXYCODONE HCL 5 MG PO TABS
5.0000 mg | ORAL_TABLET | Freq: Once | ORAL | Status: AC
  Administered 2024-02-13: 5 mg via ORAL
  Filled 2024-02-13: qty 1

## 2024-02-13 MED ORDER — CLINDAMYCIN HCL 300 MG PO CAPS: 300.0000 mg | ORAL_CAPSULE | Freq: Three times a day (TID) | ORAL | 0 refills | Status: DC

## 2024-02-13 MED ORDER — OXYCODONE HCL 5 MG PO TABS: 5.0000 mg | ORAL_TABLET | Freq: Four times a day (QID) | ORAL | 0 refills | Status: AC | PRN

## 2024-02-13 MED ORDER — LIDOCAINE HCL (PF) 1 % IJ SOLN
5.0000 mL | Freq: Once | INTRAMUSCULAR | Status: AC
  Administered 2024-02-13: 5 mL
  Filled 2024-02-13: qty 5

## 2024-02-13 NOTE — ED Provider Notes (Signed)
 [facility] Provider Note    Event Date/Time   First MD Initiated Contact with Patient 02/13/24 0800     (approximate)   History   Abscess   HPI  Roberto Dudley is a 38 y.o. male   presents to the ED with questionable abscess to the right groin area.  Patient has had similar in the past and states that he has been taking leftover amoxicillin  for the abscess which helped minimally.  Today he states that there is increased pain.  He denies any fever or chills at home.  Patient has a history of Mallory-Weiss tear, gastritis, hypertension, migraines, alcohol abuse, abscesses.      Physical Exam   Triage Vital Signs: ED Triage Vitals  Encounter Vitals Group     BP 02/13/24 0800 (!) 142/111     Girls Systolic BP Percentile --      Girls Diastolic BP Percentile --      Boys Systolic BP Percentile --      Boys Diastolic BP Percentile --      Pulse Rate 02/13/24 0800 99     Resp 02/13/24 0800 16     Temp 02/13/24 0800 98.4 F (36.9 C)     Temp Source 02/13/24 0800 Oral     SpO2 02/13/24 0800 97 %     Weight 02/13/24 0758 163 lb 9.3 oz (74.2 kg)     Height --      Head Circumference --      Peak Flow --      Pain Score 02/13/24 0757 10     Pain Loc --      Pain Education --      Exclude from Growth Chart --     Most recent vital signs: Vitals:   02/13/24 0800 02/13/24 0913  BP: (!) 142/111 (!) 133/98  Pulse: 99 81  Resp: 16 18  Temp: 98.4 F (36.9 C)   SpO2: 97% 96%     General: Awake, no distress.  Appears to be moderately uncomfortable. CV:  Good peripheral perfusion.  Resp:  Normal effort.  Abd:  No distention.  Other:  Right groin and inguinal area with moderate tenderness to light palpation.  There is a well-healed scar in this area from previous I&D.  Area is edematous with minimal erythema present.  No swelling of the scrotal area is noted.   ED Results / Procedures / Treatments   Labs (all labs ordered are listed, but  only abnormal results are displayed) Labs Reviewed  CBC WITH DIFFERENTIAL/PLATELET  BASIC METABOLIC PANEL WITH GFR     RADIOLOGY Ultrasound scrotum with Doppler per radiology shows normal arterial and venous flow. No abnormality noted of the epididymis and no hydrocele seen.  Subcutaneous abscess measuring 4.3 x 1.4 x 3.3 cm overlying the right inguinal canal.   PROCEDURES:  Critical Care performed:   .Incision and Drainage  Date/Time: 02/13/2024 10:51 AM  Performed by: Saunders Shona CROME, PA-C Authorized by: Saunders Shona CROME, PA-C   Consent:    Consent obtained:  Verbal   Consent given by:  Patient   Risks discussed:  Incomplete drainage, infection and pain Universal protocol:    Patient identity confirmed:  Verbally with patient Location:    Type:  Abscess   Location:  Anogenital   Anogenital location:  Scrotal space Pre-procedure details:    Skin preparation:  Antiseptic wash Anesthesia:    Anesthesia method:  Local infiltration   Local anesthetic:  Lidocaine   1% w/o epi Procedure type:    Complexity:  Simple Procedure details:    Incision types:  Stab incision   Incision depth:  Dermal   Wound management:  Probed and deloculated   Drainage:  Purulent   Drainage amount:  Moderate   Wound treatment:  Drain placed   Packing materials:  1/4 in iodoform gauze Post-procedure details:    Procedure completion:  Tolerated    MEDICATIONS ORDERED IN ED: Medications  oxyCODONE  (Oxy IR/ROXICODONE ) immediate release tablet 5 mg (5 mg Oral Given 02/13/24 0832)  lidocaine  (PF) (XYLOCAINE ) 1 % injection 5 mL (5 mLs Infiltration Given by Other 02/13/24 1140)     IMPRESSION / MDM / ASSESSMENT AND PLAN / ED COURSE  I reviewed the triage vital signs and the nursing notes.   Differential diagnosis includes, but is not limited to, scrotal abscess, epididymitis, hydrocele, dermal abscess, cellulitis.  38 year old male presents to the ED with complaint of an abscess near the  scrotal area.  He has had problems with this in the past with the last 1 being approximately 5 years ago.  Patient was given oxycodone  while in the ED and an incision and drainage was performed at the same site as previously done 5 years ago.  Patient was made aware that if the packing has not fallen out on its own or he is unable to remove in 2 days he is to return to the emergency department.  A prescription for oxycodone  and clindamycin  was sent to the pharmacy for him to begin taking.  Sitz bath's or warm moist compresses to the area frequently.  Return to the emergency department if any severe worsening of his symptoms.  He is also to follow-up with Dr. Jordis who is a surgeon on-call should he continue to have problems with this abscess.      Patient's presentation is most consistent with acute complicated illness / injury requiring diagnostic workup.  FINAL CLINICAL IMPRESSION(S) / ED DIAGNOSES   Final diagnoses:  Abscess of right groin     Rx / DC Orders   ED Discharge Orders          Ordered    clindamycin  (CLEOCIN ) 300 MG capsule  3 times daily        02/13/24 1129    oxyCODONE  (OXY IR/ROXICODONE ) 5 MG immediate release tablet  Every 6 hours PRN        02/13/24 1129             Note:  This document was prepared using Dragon voice recognition software and may include unintentional dictation errors.   Saunders Shona CROME, PA-C 02/13/24 1304    [name], MD 02/13/24 [phone]

## 2024-02-13 NOTE — ED Triage Notes (Signed)
 Abscess to right groin.  Started over the weekend.  Has had similar in the past, 2 months ago and had taken amoxicillin  for abscess, which resolved swelling.  AAOx3. Skin warm and dry. NAD

## 2024-02-13 NOTE — ED Notes (Signed)
 See triage note  Presents with possible abscess area to right groin area  States this started over the weekend  Has become worse  And now feeling pain to testicle

## 2024-02-13 NOTE — Discharge Instructions (Signed)
 Follow-up with your primary care provider if any continued problems.  You will also need to make an appointment with the surgeon listed on your discharge papers for possible surgery in the future to remove the abscess completely.  Use warm moist compresses or sitz bath's frequently.  A prescription for clindamycin  and oxycodone  was sent to the pharmacy.  Return to the emergency department in 2 days if the packing has not fallen out on its own or if you are unable to take it out yourself in 2 days.

## 2024-02-19 ENCOUNTER — Ambulatory Visit: Admitting: Surgery

## 2024-02-19 ENCOUNTER — Encounter: Payer: Self-pay | Admitting: Surgery

## 2024-02-19 VITALS — BP 142/96 | HR 84 | Temp 98.7°F | Ht 64.0 in | Wt 170.4 lb

## 2024-02-19 DIAGNOSIS — L02214 Cutaneous abscess of groin: Secondary | ICD-10-CM

## 2024-02-19 MED ORDER — CLINDAMYCIN HCL 300 MG PO CAPS: 300.0000 mg | ORAL_CAPSULE | Freq: Three times a day (TID) | ORAL | 0 refills | Status: AC

## 2024-02-19 NOTE — Patient Instructions (Signed)
 We have scheduled you for a CT Scan of your Abdomen and Pelvis with contrast. This has been scheduled at [facility] on 02/21/2024 . Please arrive there by 2:45 pm . If you need to reschedule your Scan, you may do so by calling [phone]. Please let us  know if you reschedule your scan as we have to get authorization from your insurance for this.    Hidradenitis Suppurativa  Hidradenitis suppurativa is a long-term (chronic) skin disease. It is similar to a severe form of acne, but it affects areas of the body where acne would be unusual, especially areas of the body where skin rubs against skin and becomes moist. These include: Underarms. Groin. Genital area. Buttocks. Upper thighs. Breasts. Hidradenitis suppurativa may start out as small lumps or pimples caused by blocked skin pores, sweat glands, or hair follicles. Pimples may develop into deep sores that break open (rupture) and drain pus. Over time, affected areas of skin may thicken and become scarred. This condition is rare and does not spread from person to person (non-contagious). What are the causes? The exact cause of this condition is not known. It may be related to: Male and male hormones. An overactive disease-fighting system (immune system). The immune system may over-react to blocked hair follicles or sweat glands and cause swelling and pus-filled sores. What increases the risk? You are more likely to develop this condition if you: Are male. Are 42-59 years old. Have a family history of hidradenitis suppurativa. Have a personal history of acne. Are overweight. Smoke. Take the medicine lithium. What are the signs or symptoms? The first symptoms are usually painful bumps in the skin, similar to pimples. The condition may get worse over time (progress), or it may only cause mild symptoms. If the disease progresses, symptoms may include: Skin bumps getting bigger and growing deeper into the skin. Bumps rupturing  and draining pus. Itchy, infected skin. Skin getting thicker and scarred. Tunnels under the skin (fistulas) where pus drains from a bump. Pain during daily activities, such as pain during walking if your groin area is affected. Emotional problems, such as stress or depression. This condition may affect your appearance and your ability or willingness to wear certain clothes or do certain activities. How is this diagnosed? This condition is diagnosed by a health care provider who specializes in skin conditions (dermatologist). You may be diagnosed based on: Your symptoms and medical history. A physical exam. Testing a pus sample for infection. Blood tests. How is this treated? Your treatment will depend on how severe your symptoms are. The same treatment will not work for everybody with this condition. You may need to try several treatments to find what works best for you. Treatment may include: Cleaning and bandaging (dressing) your wounds as needed. Lifestyle changes, such as new skin care routines. Taking medicines, such as: Antibiotics. Acne medicines. Medicines to reduce the activity of the immune system. A diabetes medicine (metformin). Birth control pills, for women. Steroids to reduce swelling and pain. Working with a mental health care provider, if you experience emotional distress due to this condition. If you have severe symptoms that do not get better with medicine, you may need surgery. Surgery may involve: Using a laser to clear the skin and remove hair follicles. Opening and draining deep sores. Removing the areas of skin that are diseased and scarred. Follow these instructions at home: Medicines  Take over-the-counter and prescription medicines only as told by your health care provider. If you were  prescribed antibiotics, take them as told by your health care provider. Do not stop using the antibiotic even if your condition improves. Skin care If you have open wounds,  cover them with a clean dressing as told by your health care provider. Keep wounds clean by washing them gently with soap and water when you bathe. Do not shave the areas where you get hidradenitis suppurativa. Wear loose-fitting clothes. Try to avoid getting overheated or sweaty. If you get sweaty or wet, change into clean, dry clothes as soon as you can. To help relieve pain and itchiness, cover sore areas with a warm, clean washcloth (warm compress) for 5-10 minutes as often as needed. Your healthcare provider may recommend an antiperspirant deodorant that may be gentle on your skin. A daily antiseptic wash to cleanse affected areas may be suggested by your healthcare provider. General instructions Learn as much as you can about your disease so that you have an active role in your treatment. Work closely with your health care provider to find treatments that work for you. If you are overweight, work with your health care provider to lose weight as recommended. Do not use any products that contain nicotine or tobacco. These products include cigarettes, chewing tobacco, and vaping devices, such as e-cigarettes. If you need help quitting, ask your health care provider. If you struggle with living with this condition, talk with your health care provider or work with a mental health care provider as recommended. Keep all follow-up visits. Where to find more information Hidradenitis Suppurativa Foundation, Inc.: www.hs-foundation.org American Academy of Dermatology: infoexam.si Contact a health care provider if: You have a flare-up of hidradenitis suppurativa. You have a fever or chills. You have trouble controlling your symptoms at home. You have trouble doing your daily activities because of your symptoms. You have trouble dealing with emotional problems related to your condition. Summary Hidradenitis suppurativa is a long-term (chronic) skin disease. It is similar to a severe form of acne, but  it affects areas of the body where acne would be unusual. The first symptoms are usually painful bumps in the skin, similar to pimples. The condition may only cause mild symptoms, or it may get worse over time (progress). If you have open wounds, cover them with a clean dressing as told by your health care provider. Keep wounds clean by washing them gently with soap and water when you bathe. Besides skin care, treatment may include medicines, laser treatment, and surgery. This information is not intended to replace advice given to you by your health care provider. Make sure you discuss any questions you have with your health care provider. Document Revised: 04/14/2021 Document Reviewed: 04/14/2021 Elsevier Patient Education  2024 [employer].
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
## 2024-02-21 ENCOUNTER — Ambulatory Visit: Admission: RE | Admit: 2024-02-21 | Discharge: 2024-02-21 | Attending: Surgery

## 2024-02-21 DIAGNOSIS — L02214 Cutaneous abscess of groin: Secondary | ICD-10-CM | POA: Insufficient documentation

## 2024-02-21 DIAGNOSIS — Z0389 Encounter for observation for other suspected diseases and conditions ruled out: Secondary | ICD-10-CM | POA: Diagnosis not present

## 2024-02-21 MED ORDER — IOHEXOL 300 MG/ML  SOLN
100.0000 mL | Freq: Once | INTRAMUSCULAR | Status: AC | PRN
  Administered 2024-02-21: 17:00:00 100 mL via INTRAVENOUS

## 2024-02-28 ENCOUNTER — Encounter: Payer: Self-pay | Admitting: Surgery

## 2024-02-28 ENCOUNTER — Ambulatory Visit: Admitting: Surgery

## 2024-02-28 VITALS — BP 128/88 | HR 79 | Ht 64.0 in | Wt 168.0 lb

## 2024-02-28 DIAGNOSIS — L732 Hidradenitis suppurativa: Secondary | ICD-10-CM
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
## 2024-02-28 NOTE — Patient Instructions (Signed)
 Finish all of your antibiotics.  Follow-up with our office as needed.  Please call and ask to speak with a nurse if you develop questions or concerns.   Hidradenitis Suppurativa Hidradenitis suppurativa is a long-term (chronic) skin disease. It is similar to a severe form of acne, but it affects areas of the body where acne would be unusual, especially areas of the body where skin rubs against skin and becomes moist. These include: Underarms. Groin. Genital area. Buttocks. Upper thighs. Breasts. Hidradenitis suppurativa may start out as small lumps or pimples caused by blocked skin pores, sweat glands, or hair follicles. Pimples may develop into deep sores that break open (rupture) and drain pus. Over time, affected areas of skin may thicken and become scarred. This condition is rare and does not spread from person to person (non-contagious). What are the causes? The exact cause of this condition is not known. It may be related to: Male and male hormones. An overactive disease-fighting system (immune system). The immune system may over-react to blocked hair follicles or sweat glands and cause swelling and pus-filled sores. What increases the risk? You are more likely to develop this condition if you: Are male. Are 91-22 years old. Have a family history of hidradenitis suppurativa. Have a personal history of acne. Are overweight. Smoke. Take the medicine lithium. What are the signs or symptoms? The first symptoms are usually painful bumps in the skin, similar to pimples. The condition may get worse over time (progress), or it may only cause mild symptoms. If the disease progresses, symptoms may include: Skin bumps getting bigger and growing deeper into the skin. Bumps rupturing and draining pus. Itchy, infected skin. Skin getting thicker and scarred. Tunnels under the skin (fistulas) where pus drains from a bump. Pain during daily activities, such as pain during walking if your  groin area is affected. Emotional problems, such as stress or depression. This condition may affect your appearance and your ability or willingness to wear certain clothes or do certain activities. How is this diagnosed? This condition is diagnosed by a health care provider who specializes in skin conditions (dermatologist). You may be diagnosed based on: Your symptoms and medical history. A physical exam. Testing a pus sample for infection. Blood tests. How is this treated? Your treatment will depend on how severe your symptoms are. The same treatment will not work for everybody with this condition. You may need to try several treatments to find what works best for you. Treatment may include: Cleaning and bandaging (dressing) your wounds as needed. Lifestyle changes, such as new skin care routines. Taking medicines, such as: Antibiotics. Acne medicines. Medicines to reduce the activity of the immune system. A diabetes medicine (metformin). Birth control pills, for women. Steroids to reduce swelling and pain. Working with a mental health care provider, if you experience emotional distress due to this condition. If you have severe symptoms that do not get better with medicine, you may need surgery. Surgery may involve: Using a laser to clear the skin and remove hair follicles. Opening and draining deep sores. Removing the areas of skin that are diseased and scarred. Follow these instructions at home: Medicines  Take over-the-counter and prescription medicines only as told by your health care provider. If you were prescribed antibiotics, take them as told by your health care provider. Do not stop using the antibiotic even if your condition improves. Skin care If you have open wounds, cover them with a clean dressing as told by your health care provider.  Keep wounds clean by washing them gently with soap and water when you bathe. Do not shave the areas where you get hidradenitis  suppurativa. Wear loose-fitting clothes. Try to avoid getting overheated or sweaty. If you get sweaty or wet, change into clean, dry clothes as soon as you can. To help relieve pain and itchiness, cover sore areas with a warm, clean washcloth (warm compress) for 5-10 minutes as often as needed. Your healthcare provider may recommend an antiperspirant deodorant that may be gentle on your skin. A daily antiseptic wash to cleanse affected areas may be suggested by your healthcare provider. General instructions Learn as much as you can about your disease so that you have an active role in your treatment. Work closely with your health care provider to find treatments that work for you. If you are overweight, work with your health care provider to lose weight as recommended. Do not use any products that contain nicotine or tobacco. These products include cigarettes, chewing tobacco, and vaping devices, such as e-cigarettes. If you need help quitting, ask your health care provider. If you struggle with living with this condition, talk with your health care provider or work with a mental health care provider as recommended. Keep all follow-up visits. Where to find more information Hidradenitis Suppur[employer].: www.hs-foundation.org [employer]: infoexam.si Contact a health care provider if: You have a flare-up of hidradenitis suppurativa. You have a fever or chills. You have trouble controlling your symptoms at home. You have trouble doing your daily activities because of your symptoms. You have trouble dealing with emotional problems related to your condition. Summary Hidradenitis suppurativa is a long-term (chronic) skin disease. It is similar to a severe form of acne, but it affects areas of the body where acne would be unusual. The first symptoms are usually painful bumps in the skin, similar to pimples. The condition may only cause mild symptoms, or it may get  worse over time (progress). If you have open wounds, cover them with a clean dressing as told by your health care provider. Keep wounds clean by washing them gently with soap and water when you bathe. Besides skin care, treatment may include medicines, laser treatment, and surgery. This information is not intended to replace advice given to you by your health care provider. Make sure you discuss any questions you have with your health care provider. Document Revised: 04/14/2021 Document Reviewed: 04/14/2021 Elsevier Patient Education  2024 [employer].

## 2024-07-11 ENCOUNTER — Encounter: Admitting: Nurse Practitioner
